# Patient Record
Sex: Female | Born: 1970 | Hispanic: Yes | Marital: Married | Smoking: Never smoker
Health system: Southern US, Community
[De-identification: ages and names within clinical notes are randomized; demographics above are authoritative.]

## PROBLEM LIST (undated history)

## (undated) DIAGNOSIS — A609 Anogenital herpesviral infection, unspecified: Secondary | ICD-10-CM

## (undated) DIAGNOSIS — I10 Essential (primary) hypertension: Secondary | ICD-10-CM

## (undated) DIAGNOSIS — D509 Iron deficiency anemia, unspecified: Secondary | ICD-10-CM

## (undated) DIAGNOSIS — E663 Overweight: Secondary | ICD-10-CM

## (undated) DIAGNOSIS — I248 Other forms of acute ischemic heart disease: Secondary | ICD-10-CM

## (undated) DIAGNOSIS — D3500 Benign neoplasm of unspecified adrenal gland: Secondary | ICD-10-CM

## (undated) DIAGNOSIS — I2489 Other forms of acute ischemic heart disease: Secondary | ICD-10-CM

## (undated) DIAGNOSIS — I5189 Other ill-defined heart diseases: Secondary | ICD-10-CM

## (undated) DIAGNOSIS — I454 Nonspecific intraventricular block: Secondary | ICD-10-CM

## (undated) DIAGNOSIS — E119 Type 2 diabetes mellitus without complications: Secondary | ICD-10-CM

## (undated) DIAGNOSIS — I4510 Unspecified right bundle-branch block: Secondary | ICD-10-CM

## (undated) DIAGNOSIS — D649 Anemia, unspecified: Secondary | ICD-10-CM

## (undated) HISTORY — PX: OTHER SURGICAL HISTORY: SHX169

## (undated) HISTORY — DX: Other ill-defined heart diseases: I51.89

## (undated) HISTORY — PX: TUBAL LIGATION: SHX77

---

## 2005-07-09 ENCOUNTER — Ambulatory Visit: Payer: Self-pay | Admitting: Obstetrics and Gynecology

## 2005-08-25 ENCOUNTER — Inpatient Hospital Stay: Payer: Self-pay | Admitting: Obstetrics and Gynecology

## 2010-03-28 ENCOUNTER — Ambulatory Visit: Payer: Self-pay | Admitting: Unknown Physician Specialty

## 2010-08-22 ENCOUNTER — Emergency Department: Payer: Self-pay | Admitting: Emergency Medicine

## 2011-05-20 ENCOUNTER — Ambulatory Visit: Payer: Self-pay | Admitting: Unknown Physician Specialty

## 2012-05-29 ENCOUNTER — Emergency Department: Payer: Self-pay | Admitting: Emergency Medicine

## 2012-06-07 ENCOUNTER — Emergency Department: Payer: Self-pay | Admitting: Internal Medicine

## 2012-07-14 ENCOUNTER — Ambulatory Visit: Payer: Self-pay | Admitting: Unknown Physician Specialty

## 2012-10-15 ENCOUNTER — Ambulatory Visit: Payer: Self-pay | Admitting: Unknown Physician Specialty

## 2012-11-19 ENCOUNTER — Ambulatory Visit: Payer: Self-pay | Admitting: Unknown Physician Specialty

## 2012-12-07 ENCOUNTER — Ambulatory Visit: Payer: Self-pay | Admitting: Gastroenterology

## 2012-12-08 LAB — PATHOLOGY REPORT

## 2012-12-20 ENCOUNTER — Ambulatory Visit: Payer: Self-pay | Admitting: Unknown Physician Specialty

## 2012-12-20 LAB — CREATININE, SERUM
EGFR (African American): >60
EGFR (Non-African Amer.): >60

## 2013-02-06 HISTORY — PX: OTHER SURGICAL HISTORY: SHX169

## 2013-02-24 ENCOUNTER — Ambulatory Visit: Payer: Self-pay | Admitting: Surgery

## 2013-02-24 LAB — CBC WITH DIFFERENTIAL/PLATELET
Basophil #: 0.1 10*3/uL (ref 0.0–0.1)
Basophil %: 0.8 %
HGB: 12.6 g/dL (ref 12.0–16.0)
Lymphocyte #: 2.9 10*3/uL (ref 1.0–3.6)
MCH: 29.3 pg (ref 26.0–34.0)
Monocyte #: 0.6 x10 3/mm (ref 0.2–0.9)
Monocyte %: 7.3 %
Neutrophil %: 51.7 %
RBC: 4.3 10*6/uL (ref 3.80–5.20)
RDW: 14.2 % (ref 11.5–14.5)
WBC: 7.6 10*3/uL (ref 3.6–11.0)

## 2013-02-24 LAB — PROTIME-INR
INR: 0.9
Prothrombin Time: 12.7 secs (ref 11.5–14.7)

## 2013-02-24 LAB — COMPREHENSIVE METABOLIC PANEL
BUN: 10 mg/dL (ref 7–18)
Bilirubin,Total: 0.3 mg/dL (ref 0.2–1.0)
Chloride: 105 mmol/L (ref 98–107)
Creatinine: 0.52 mg/dL — ABNORMAL LOW (ref 0.60–1.30)
Osmolality: 274 (ref 275–301)
Potassium: 3.6 mmol/L (ref 3.5–5.1)
SGOT(AST): 18 U/L (ref 15–37)
Sodium: 138 mmol/L (ref 136–145)

## 2013-02-24 LAB — APTT: Activated PTT: 34.7 secs (ref 23.6–35.9)

## 2013-02-28 ENCOUNTER — Inpatient Hospital Stay: Payer: Self-pay | Admitting: Surgery

## 2013-03-01 LAB — COMPREHENSIVE METABOLIC PANEL
Albumin: 2.6 g/dL — ABNORMAL LOW (ref 3.4–5.0)
Anion Gap: 7 (ref 7–16)
BUN: 3 mg/dL — ABNORMAL LOW (ref 7–18)
Calcium, Total: 7.7 mg/dL — ABNORMAL LOW (ref 8.5–10.1)
Chloride: 105 mmol/L (ref 98–107)
Co2: 25 mmol/L (ref 21–32)
Creatinine: 0.4 mg/dL — ABNORMAL LOW (ref 0.60–1.30)
EGFR (African American): >60
Glucose: 96 mg/dL (ref 65–99)
Potassium: 2.9 mmol/L — ABNORMAL LOW (ref 3.5–5.1)
SGOT(AST): 22 U/L (ref 15–37)
SGPT (ALT): 25 U/L (ref 12–78)
Sodium: 137 mmol/L (ref 136–145)

## 2013-03-01 LAB — CBC WITH DIFFERENTIAL/PLATELET
Eosinophil #: 0.1 10*3/uL (ref 0.0–0.7)
Eosinophil %: 0.6 %
Lymphocyte #: 1.6 10*3/uL (ref 1.0–3.6)
MCHC: 34.2 g/dL (ref 32.0–36.0)
MCV: 88 fL (ref 80–100)
Monocyte %: 6.4 %
Neutrophil %: 76.5 %
Platelet: 288 10*3/uL (ref 150–440)
RBC: 3.83 10*6/uL (ref 3.80–5.20)
RDW: 14.2 % (ref 11.5–14.5)

## 2013-03-02 LAB — CBC WITH DIFFERENTIAL/PLATELET
Basophil #: 0 10*3/uL (ref 0.0–0.1)
Basophil %: 0.4 %
Eosinophil %: 0.7 %
HCT: 31.8 % — ABNORMAL LOW (ref 35.0–47.0)
HGB: 10.8 g/dL — ABNORMAL LOW (ref 12.0–16.0)
Lymphocyte #: 1.9 10*3/uL (ref 1.0–3.6)
Lymphocyte %: 19.1 %
MCH: 29.8 pg (ref 26.0–34.0)
MCHC: 34 g/dL (ref 32.0–36.0)
MCV: 88 fL (ref 80–100)
Monocyte #: 0.7 x10 3/mm (ref 0.2–0.9)
Neutrophil #: 7 10*3/uL — ABNORMAL HIGH (ref 1.4–6.5)
Platelet: 292 10*3/uL (ref 150–440)
RBC: 3.63 10*6/uL — ABNORMAL LOW (ref 3.80–5.20)
RDW: 14.3 % (ref 11.5–14.5)

## 2013-03-02 LAB — BASIC METABOLIC PANEL
Anion Gap: 5 — ABNORMAL LOW (ref 7–16)
BUN: 3 mg/dL — ABNORMAL LOW (ref 7–18)
Chloride: 109 mmol/L — ABNORMAL HIGH (ref 98–107)
Co2: 27 mmol/L (ref 21–32)
Creatinine: 0.59 mg/dL — ABNORMAL LOW (ref 0.60–1.30)
EGFR (African American): >60
EGFR (Non-African Amer.): >60
Glucose: 95 mg/dL (ref 65–99)
Potassium: 3.2 mmol/L — ABNORMAL LOW (ref 3.5–5.1)
Sodium: 141 mmol/L (ref 136–145)

## 2013-03-02 LAB — PATHOLOGY REPORT

## 2013-12-06 ENCOUNTER — Ambulatory Visit: Payer: Self-pay | Admitting: Internal Medicine

## 2014-09-14 IMAGING — CT CT ABD-PELV W/ CM
1 of 3 series · 13 of 32 positions shown, 18 images · non-contrast
Comparison: none

REASON FOR EXAM: CALL REPORT [DATE] X 9911 sw Press LLQ abd pain Eval for
stone or mass Recta...
COMMENTS:

[Series 2: abd 3mm w 3.0 i40f 3 · axial · 0.81mm/px · z∈[-336,+56]mm · 13 of 149 slices shown, 18 images]
[im 9/149  soft-tissue]
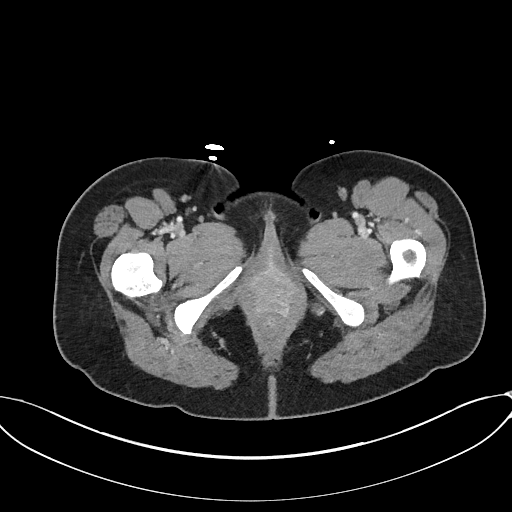
[im 9/149  bone]
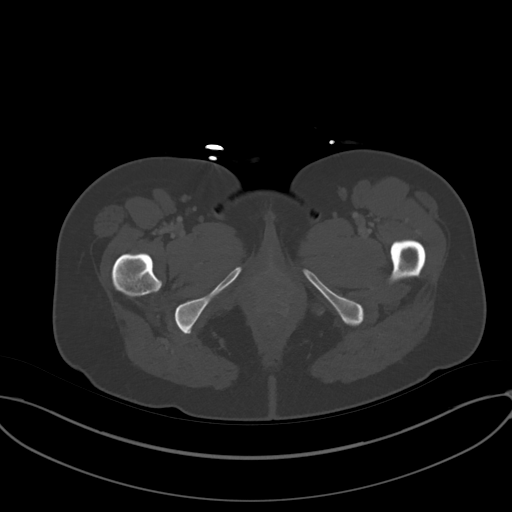
[im 27/149  soft-tissue]
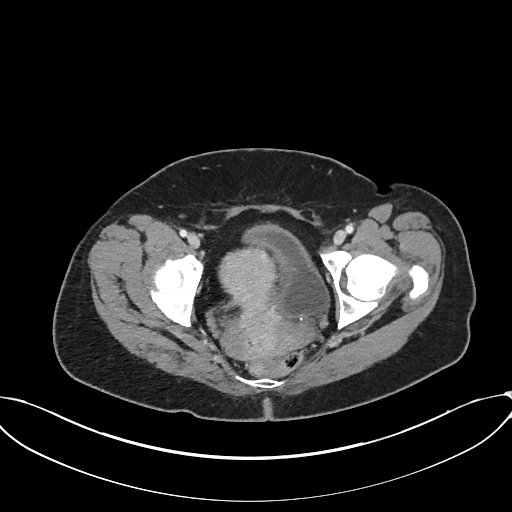
[im 35/149  soft-tissue]
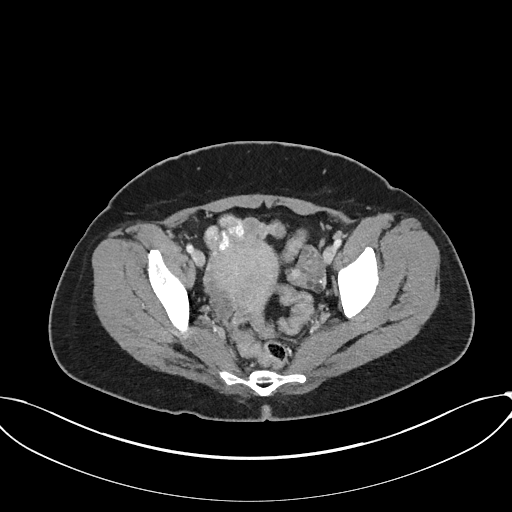
[im 44/149  soft-tissue]
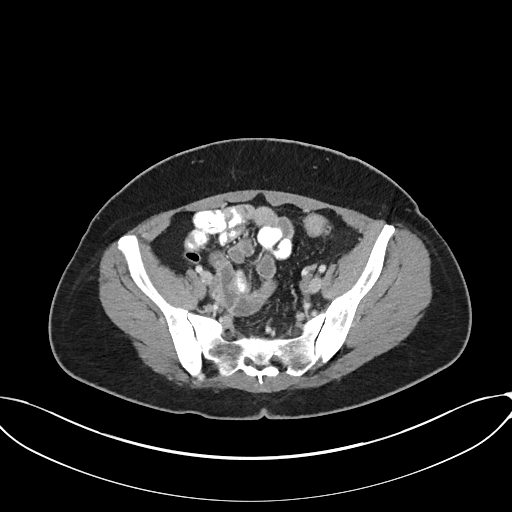
[im 61/149  soft-tissue]
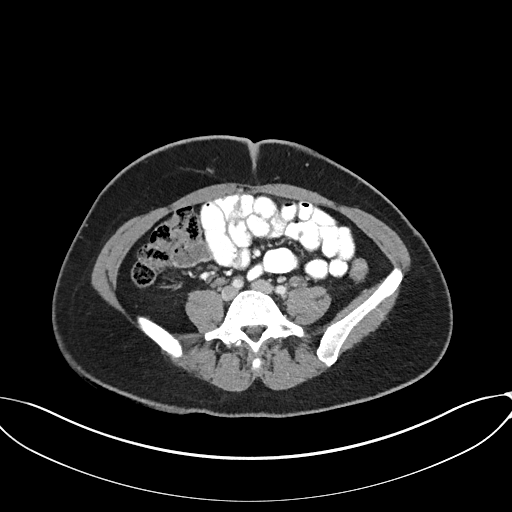
[im 70/149  soft-tissue]
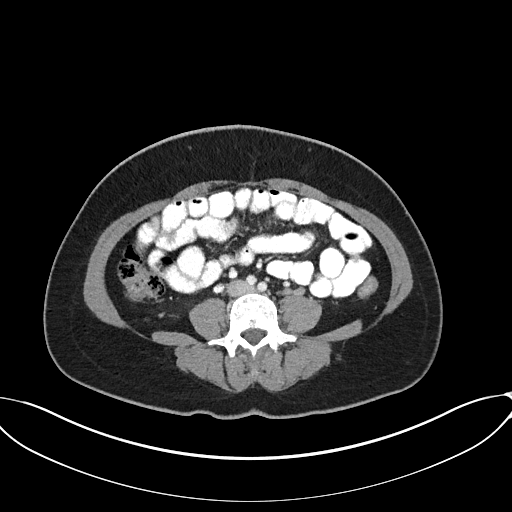
[im 79/149  soft-tissue]
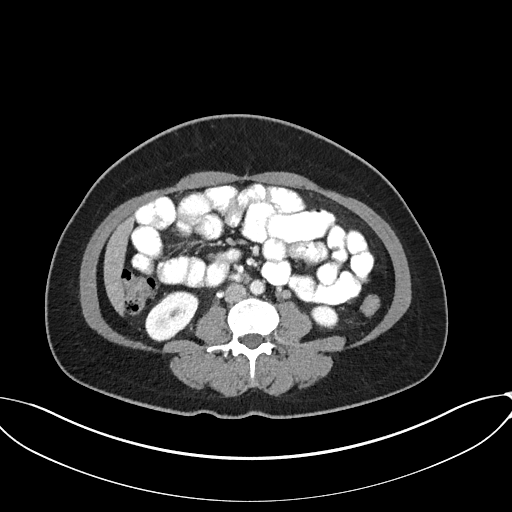
[im 96/149  soft-tissue]
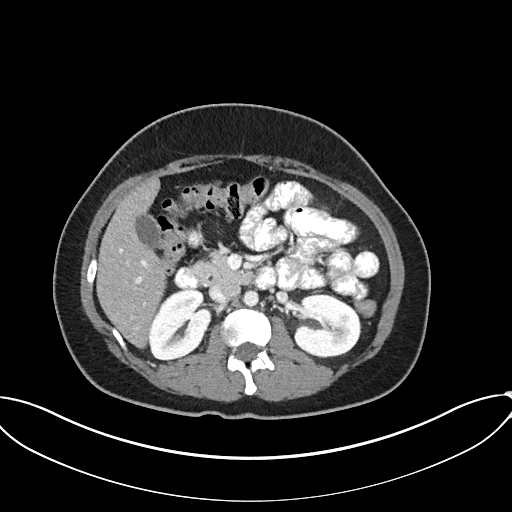
[im 105/149  soft-tissue]
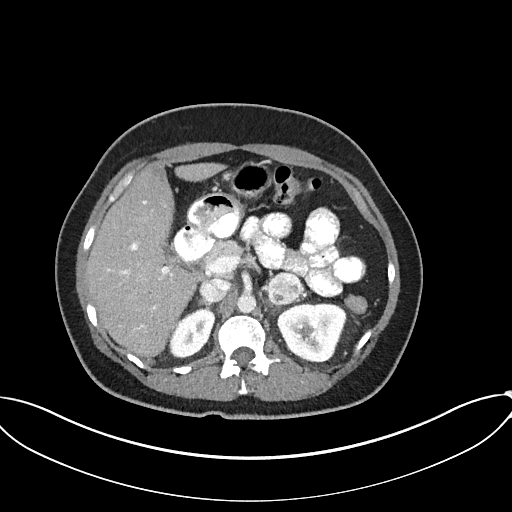
[im 105/149  bone]
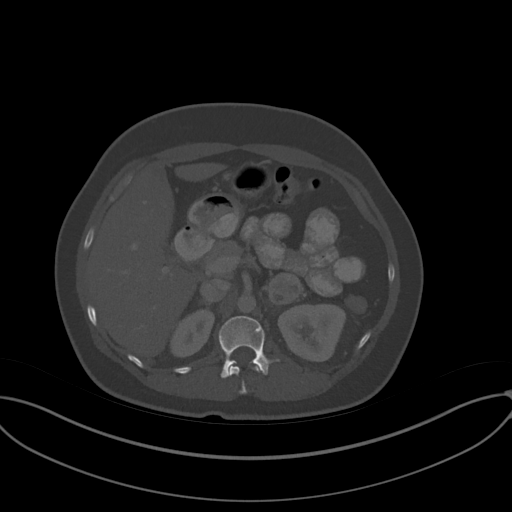
[im 114/149  soft-tissue]
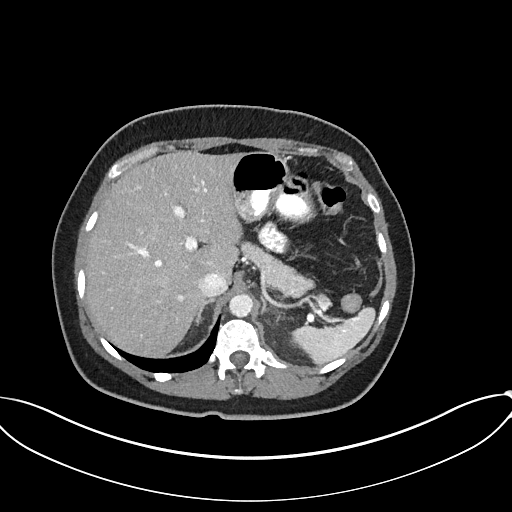
[im 114/149  lung]
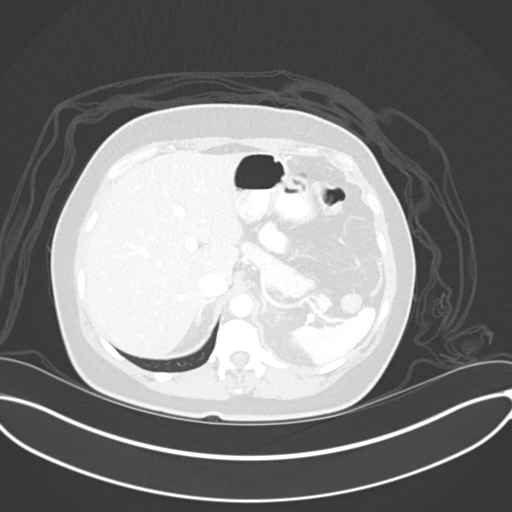
[im 122/149  lung]
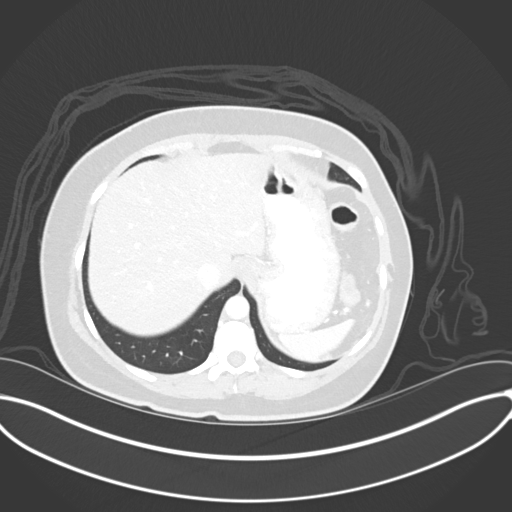
[im 131/149  soft-tissue]
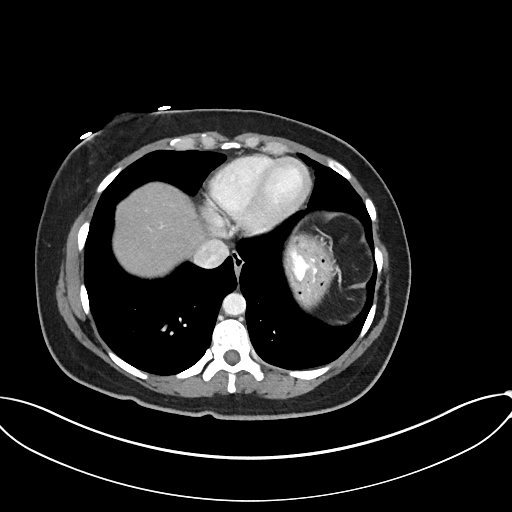
[im 131/149  lung]
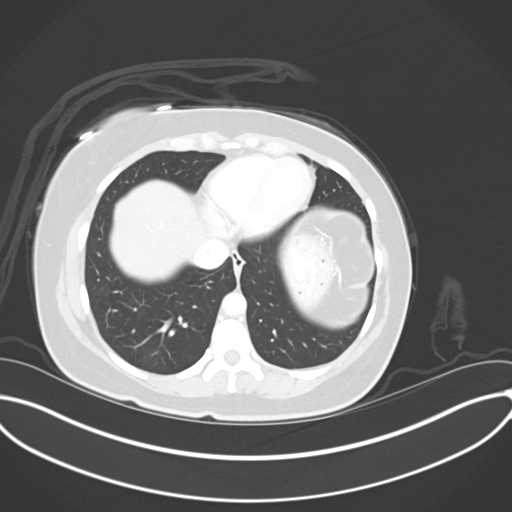
[im 140/149  soft-tissue]
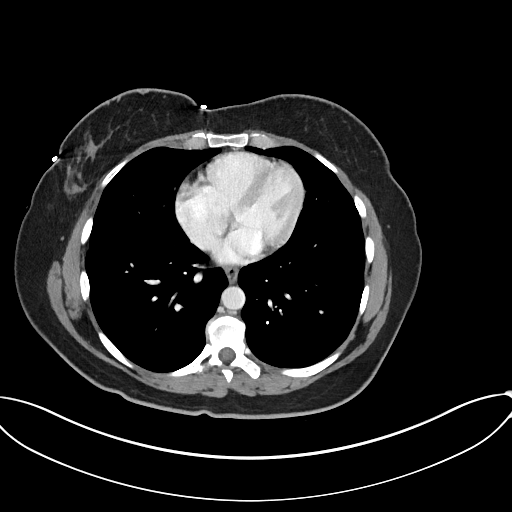
[im 140/149  lung]
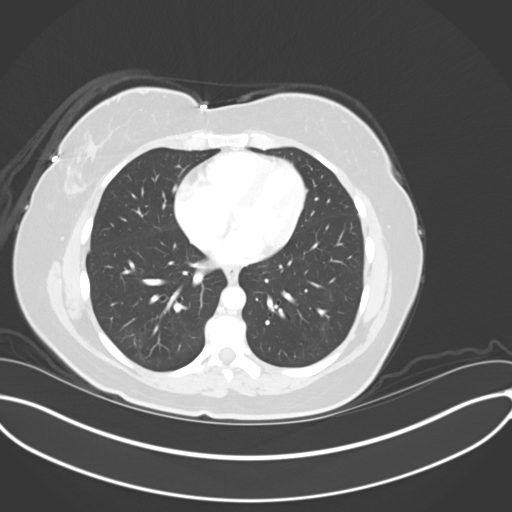

[13 of 32 positions shown; findings below may reference images not displayed]

PROCEDURE:     KCT - KCT ABDOMEN/PELVIS W  - November 19, 2012  [DATE]

RESULT:      CT of the abdomen and pelvis is performed utilizing 85 mL of
Hsovue-EVR iodinated intravenous contrast and oral contrast. There is no
previous exam for comparison. Delayed postcontrast images were obtained
through the kidneys only. The oral contrast has not passed completely
through the small bowel or intracolon at the time of imaging.

The lung bases appear grossly clear with no discrete mass, infiltrate or
effusion. The spleen is unremarkable. There appears to be a normal
appearance of the liver with some fatty infiltration present. Gallbladder
shows no radiopaque stones. The kidneys show no hydronephrosis. There is no
definite evidence of hydroureter although the distal ureters are not well
seen given the contrast-enhanced exam with oral contrast present. The uterus
and adnexal structures appear to be within normal limits. The appendix is
unremarkable. There is no ascites or abnormal fluid collection area there is
no abscess or acute inflammatory change. The stomach, small bowel and colon
appear to be grossly normal. The adrenal glands, aorta and pancreas appear
within normal limits. There is no pancreatic mass or accurate ductal
dilation. No adenopathy is evident. The bony structures appear unremarkable.
The rectum is not well seen given the lack of air, stool or contrast.
IMPRESSION: 1. No definite hydronephrosis or nephrolithiasis. The distal ureters not
well seen. The appendix is normal. No radiopaque gallstones are evident. The
lung bases are clear. No acute abnormality evident.

[REDACTED]

Addendum:
1. There is a mass along the inferior aspect of the left adrenal not
mentioned above. On the immediate post contrast images this measures 2.3 cm
and has a Hounsfield reading of 164. The postcontrast delayed images show
significant decrease in opacification of the mass with a Hounsfield reading
of 99. Given that no noncontrast images were obtained a relative and
absolute contrast washout cannot be determined. Further investigation with
MRI may be beneficial to ensure this is an adrenal adenoma versus a
functioning or possibly malignant lesion.

## 2014-12-29 NOTE — Discharge Summary (Signed)
PATIENT NAME:  Leah Stephenson, Leah Stephenson MR#:  [id number] DATE OF BIRTH:  May 14, 1971  DATE OF ADMISSION:  02/28/2013 DATE OF DISCHARGE:  03/04/2013  FINAL DIAGNOSIS: Left adrenal pheochromocytoma.   PROCEDURE PERFORMED: Laparoscopic-assisted adrenalectomy on 02/28/2013.   POSTOPERATIVE COURSE: The patient had an uneventful stay. She was monitored for 6 hours in the PACU. She remained hemodynamically stable. She was remained on D10 infusion over the course of the evening. This was able to be weaned. Her glucose remained well controlled. Blood pressure was well controlled on no medications postoperatively. Pain was a major issue. This was controlled with PCA and eventually the patient on postoperative day #3 was able to be weaned off of her PCA and oxygen. She was fully ambulatory with minimal tenderness, tolerating a regular diet with blood pressure 125/84. On postoperative day #4, the patient was deemed suitable for discharge. Her wounds were healing nicely.   DISCHARGE MEDICATIONS: Metformin 500 mg by mouth once a day in the morning and 2 tablets at bedtime, Norco 5/325 mg 1 tab every 6 hours as needed for pain.   DISCHARGE INSTRUCTIONS: Follow up in our office in 5 to 6 days time in the [facility] location for staple removal and followup. Pathology did return benign pheochromocytoma. Call with any questions or concerns.  ____________________________ Jeannette How Marina Gravel, MD mab:aw D: 03/18/2013 02:52:19 ET T: 03/18/2013 06:49:55 ET JOB#: [id number]  cc: Elta [place] A. Marina Gravel, MD, <Dictator> Lorenza Evangelist, MD [place] MD ELECTRONICALLY SIGNED 03/20/2013 21:57

## 2014-12-29 NOTE — Op Note (Signed)
PATIENT NAME:  Leah Stephenson, Leah Stephenson MR#:  [id number] DATE OF BIRTH:  [birth year]  DATE OF PROCEDURE:  02/28/2013  PREOPERATIVE DIAGNOSIS:  Pheochromocytoma, left adrenal gland.   POSTOPERATIVE DIAGNOSIS:  Pheochromocytoma, left adrenal gland.   SURGEON:  Consuela Mimes, M.D.   FIRST ASSISTANT:  Sherri Rad, M.D.   ANESTHESIA:  General.   OPERATION PERFORMED:  1.  Hand-assisted laparoscopic left adrenalectomy.  2.  Hand-assisted laparoscopic splenic flexure mobilization.   PROCEDURE IN DETAIL: The patient was placed in the modified lithotomy position and prepped and draped in the usual sterile fashion. A 4 inch incision was made in the midline in the periumbilical area, and this was carried down through the subcutaneous tissue and through the linea alba and into the peritoneum carefully. A GelPort was placed, and a 15 mmHg CO2 pneumoperitoneum was created, and a 12 mm trocar was placed in the left mid abdomen, and two 5 mm trocars were placed in the right upper quadrant. The white line of Toldt of the descending colon was taken down, and the splenic flexure of the colon was completely mobilized off of Gerota's fascia, and small vessel was taken going to the spleen, which caused an insignificant and small splenic infarct but presented avulsion. The tumor was easily seen and palpated medial to the upper pole of the left kidney, and initial dissection was begun at the inferior border of the tumor such that the left renal vein could be dissected out. This was done successfully, and an upper pole left renal vein was found and then the left renal vein was traced medially toward the right side until the left adrenal vein was found, and it was encircled and doubly clipped with Kelly-Wick locking clips towards the patient's side, and a single clip was placed towards the adrenal, and the adrenal vein was divided. The remainder of the left adrenalectomy was performed with the Harmonic scalpel, and individual small  arterial bleeders were controlled with the Harmonic scalpel, and the adrenal gland was then removed from the patient. The patient experienced a little bit of tachycardia and required esmolol for a small portion of the procedure to be administered by anesthesiology. A single lymph node was then removed and sent as a separate specimen, and the splenic flexure was replaced in its anatomic position after hemostasis was deemed excellent, and the 12 mm port site was closed with a laparoscopic puncture closure device and a 0 Vicryl suture under direct visualization. The remaining trocars were removed, and the GelPort was removed, and the linea alba was closed with a running #1 PDS suture, and all the skin sites were closed with skin stapling device and sterile dressings were applied. The patient required a little bit of phenylephrine or Neo-Synephrine IV drip at the conclusion of the procedure. This was discontinued upon entrance to the PACU.   ESTIMATED BLOOD LOSS:  75 mL.     ____________________________ Consuela Mimes, MD wfm:dmm D: 02/28/2013 12:35:04 ET T: 02/28/2013 12:50:04 ET JOB#: [id number]  cc: Consuela Mimes, MD, <Dictator> Consuela Mimes MD ELECTRONICALLY SIGNED 03/06/2013 20:22

## 2015-06-04 ENCOUNTER — Other Ambulatory Visit: Payer: Self-pay | Admitting: Internal Medicine

## 2015-06-04 DIAGNOSIS — Z1239 Encounter for other screening for malignant neoplasm of breast: Secondary | ICD-10-CM

## 2015-06-13 ENCOUNTER — Ambulatory Visit: Payer: Self-pay | Attending: Internal Medicine

## 2016-06-19 ENCOUNTER — Other Ambulatory Visit: Payer: Self-pay | Admitting: Internal Medicine

## 2016-06-19 DIAGNOSIS — Z1239 Encounter for other screening for malignant neoplasm of breast: Secondary | ICD-10-CM

## 2016-07-24 ENCOUNTER — Ambulatory Visit: Payer: Self-pay

## 2016-09-03 ENCOUNTER — Ambulatory Visit
Admission: RE | Admit: 2016-09-03 | Discharge: 2016-09-03 | Disposition: A | Discharge status: Home / Self Care | Payer: Self-pay | Source: Ambulatory Visit | Attending: Internal Medicine | Admitting: Internal Medicine

## 2016-09-03 DIAGNOSIS — Z1239 Encounter for other screening for malignant neoplasm of breast: Secondary | ICD-10-CM

## 2016-09-03 DIAGNOSIS — Z1231 Encounter for screening mammogram for malignant neoplasm of breast: Secondary | ICD-10-CM | POA: Insufficient documentation

## 2017-12-29 ENCOUNTER — Other Ambulatory Visit: Payer: Self-pay | Admitting: Internal Medicine

## 2017-12-29 DIAGNOSIS — Z1231 Encounter for screening mammogram for malignant neoplasm of breast: Secondary | ICD-10-CM

## 2018-01-27 ENCOUNTER — Ambulatory Visit
Admission: RE | Admit: 2018-01-27 | Discharge: 2018-01-27 | Disposition: A | Discharge status: Home / Self Care | Payer: BLUE CROSS/BLUE SHIELD | Source: Ambulatory Visit | Attending: Internal Medicine | Admitting: Internal Medicine

## 2018-01-27 DIAGNOSIS — Z1231 Encounter for screening mammogram for malignant neoplasm of breast: Secondary | ICD-10-CM | POA: Insufficient documentation

## 2018-02-04 ENCOUNTER — Other Ambulatory Visit: Payer: Self-pay | Admitting: Internal Medicine

## 2018-02-04 DIAGNOSIS — N6489 Other specified disorders of breast: Secondary | ICD-10-CM

## 2018-02-04 DIAGNOSIS — R928 Other abnormal and inconclusive findings on diagnostic imaging of breast: Secondary | ICD-10-CM

## 2018-02-12 ENCOUNTER — Ambulatory Visit
Admission: RE | Admit: 2018-02-12 | Discharge: 2018-02-12 | Disposition: A | Discharge status: Home / Self Care | Payer: BLUE CROSS/BLUE SHIELD | Source: Ambulatory Visit | Attending: Internal Medicine | Admitting: Internal Medicine

## 2018-02-12 ENCOUNTER — Ambulatory Visit: Payer: BLUE CROSS/BLUE SHIELD

## 2018-07-08 ENCOUNTER — Encounter: Payer: Self-pay | Admitting: *Deleted

## 2018-07-09 ENCOUNTER — Ambulatory Visit
Admission: RE | Admit: 2018-07-09 | Discharge: 2018-07-09 | Disposition: A | Discharge status: Home / Self Care | Payer: BLUE CROSS/BLUE SHIELD | Source: Ambulatory Visit | Attending: Gastroenterology | Admitting: Gastroenterology

## 2018-07-09 ENCOUNTER — Ambulatory Visit: Payer: BLUE CROSS/BLUE SHIELD | Admitting: Anesthesiology

## 2018-07-09 ENCOUNTER — Encounter: Payer: Self-pay | Admitting: *Deleted

## 2018-07-09 ENCOUNTER — Encounter
Admission: RE | Disposition: A | Discharge status: Home / Self Care | Payer: Self-pay | Source: Ambulatory Visit | Attending: Gastroenterology

## 2018-07-09 DIAGNOSIS — K2950 Unspecified chronic gastritis without bleeding: Secondary | ICD-10-CM | POA: Insufficient documentation

## 2018-07-09 DIAGNOSIS — I10 Essential (primary) hypertension: Secondary | ICD-10-CM | POA: Insufficient documentation

## 2018-07-09 DIAGNOSIS — K921 Melena: Secondary | ICD-10-CM | POA: Insufficient documentation

## 2018-07-09 DIAGNOSIS — Z86010 Personal history of colonic polyps: Secondary | ICD-10-CM | POA: Diagnosis not present

## 2018-07-09 DIAGNOSIS — D120 Benign neoplasm of cecum: Secondary | ICD-10-CM | POA: Diagnosis not present

## 2018-07-09 DIAGNOSIS — K5730 Diverticulosis of large intestine without perforation or abscess without bleeding: Secondary | ICD-10-CM | POA: Insufficient documentation

## 2018-07-09 DIAGNOSIS — D509 Iron deficiency anemia, unspecified: Secondary | ICD-10-CM | POA: Insufficient documentation

## 2018-07-09 DIAGNOSIS — D125 Benign neoplasm of sigmoid colon: Secondary | ICD-10-CM | POA: Insufficient documentation

## 2018-07-09 HISTORY — DX: Essential (primary) hypertension: I10

## 2018-07-09 HISTORY — DX: Anemia, unspecified: D64.9

## 2018-07-09 HISTORY — PX: COLONOSCOPY WITH PROPOFOL: SHX5780

## 2018-07-09 HISTORY — DX: Nonspecific intraventricular block: I45.4

## 2018-07-09 HISTORY — PX: ESOPHAGOGASTRODUODENOSCOPY (EGD) WITH PROPOFOL: SHX5813

## 2018-07-09 HISTORY — DX: Benign neoplasm of unspecified adrenal gland: D35.00

## 2018-07-09 HISTORY — DX: Anogenital herpesviral infection, unspecified: A60.9

## 2018-07-09 SURGERY — COLONOSCOPY WITH PROPOFOL
Anesthesia: General

## 2018-07-09 MED ORDER — PHENYLEPHRINE HCL 10 MG/ML IJ SOLN
INTRAMUSCULAR | Status: DC | PRN
  Administered 2018-07-09: 200 ug via INTRAVENOUS

## 2018-07-09 MED ORDER — LIDOCAINE HCL (PF) 1 % IJ SOLN
2.0000 mL | Freq: Once | INTRAMUSCULAR | Status: AC
  Administered 2018-07-09: 0.3 mL via INTRADERMAL

## 2018-07-09 MED ORDER — PROPOFOL 10 MG/ML IV BOLUS
INTRAVENOUS | Status: AC
  Filled 2018-07-09: qty 40

## 2018-07-09 MED ORDER — PROPOFOL 500 MG/50ML IV EMUL
INTRAVENOUS | Status: DC | PRN
  Administered 2018-07-09: 100 ug/kg/min via INTRAVENOUS

## 2018-07-09 MED ORDER — LIDOCAINE HCL (PF) 1 % IJ SOLN
INTRAMUSCULAR | Status: AC
  Administered 2018-07-09: 0.3 mL via INTRADERMAL
  Filled 2018-07-09: qty 2

## 2018-07-09 MED ORDER — LIDOCAINE HCL (CARDIAC) PF 100 MG/5ML IV SOSY
PREFILLED_SYRINGE | INTRAVENOUS | Status: DC | PRN
  Administered 2018-07-09: 80 mg via INTRAVENOUS

## 2018-07-09 MED ORDER — FENTANYL CITRATE (PF) 100 MCG/2ML IJ SOLN
INTRAMUSCULAR | Status: AC
  Filled 2018-07-09: qty 2

## 2018-07-09 MED ORDER — PROPOFOL 10 MG/ML IV BOLUS
INTRAVENOUS | Status: DC | PRN
  Administered 2018-07-09: 70 mg via INTRAVENOUS
  Administered 2018-07-09: 50 mg via INTRAVENOUS
  Administered 2018-07-09: 30 mg via INTRAVENOUS

## 2018-07-09 MED ORDER — SODIUM CHLORIDE 0.9 % IV SOLN
INTRAVENOUS | Status: DC
  Administered 2018-07-09: 1000 mL via INTRAVENOUS

## 2018-07-09 MED ORDER — FENTANYL CITRATE (PF) 100 MCG/2ML IJ SOLN
INTRAMUSCULAR | Status: DC | PRN
  Administered 2018-07-09: 50 ug via INTRAVENOUS
  Administered 2018-07-09 (×2): 25 ug via INTRAVENOUS

## 2018-07-09 NOTE — Op Note (Signed)
[facility] Gastroenterology Patient Name: Leah Stephenson Procedure Date: 07/09/2018 9:38 AM MRN: [id number] Account #: [id number] Date of Birth: Nov 23, 1970 Admit Type: Outpatient Age: 47 Room: [facility] ENDO ROOM 3 Gender: Female Note Status: Finalized Procedure:            Upper GI endoscopy Indications:          Iron deficiency anemia, Dyspepsia, Melena Providers:            Lollie Sails, MD Referring MD:         Glendon Axe (Referring MD) Medicines:            Monitored Anesthesia Care Complications:        No immediate complications. Procedure:            Pre-Anesthesia Assessment:                       - ASA Grade Assessment: II - A patient with mild                        systemic disease.                       After obtaining informed consent, the endoscope was                        passed under direct vision. Throughout the procedure,                        the patient's blood pressure, pulse, and oxygen                        saturations were monitored continuously. The Endoscope                        was introduced through the mouth, and advanced to the                        third part of duodenum. The upper GI endoscopy was                        accomplished without difficulty. The patient tolerated                        the procedure well. Findings:      The Z-line was variable. Biopsies were taken with a cold forceps for       histology.      The exam of the esophagus was otherwise normal.      The entire examined stomach was normal. Biopsies were taken with a cold       forceps for histology. Biopsies were taken with a cold forceps for       Helicobacter pylori testing.      The examined duodenum was normal.      The cardia and gastric fundus were normal on retroflexion. Impression:           - Z-line variable. Biopsied.                       - Normal stomach. Biopsied.                       -  Normal examined duodenum. Recommendation:        - Use Prilosec (omeprazole) 20 mg PO daily daily. Procedure Code(s):    --- Professional ---                       [id number], Esophagogastroduodenoscopy, flexible, transoral;                        with biopsy, single or multiple CPT copyright 2018 American Medical Association. All rights reserved. The codes documented in this report are preliminary and upon coder review may  be revised to meet current compliance requirements. Lollie Sails, MD 07/09/2018 10:16:55 AM This report has been signed electronically. Number of Addenda: 0 Note Initiated On: 07/09/2018 9:38 AM      [facility]

## 2018-07-09 NOTE — Anesthesia Post-op Follow-up Note (Signed)
Anesthesia QCDR form completed.        

## 2018-07-09 NOTE — Anesthesia Preprocedure Evaluation (Signed)
Anesthesia Evaluation  Patient identified by MRN, date of birth, ID band Patient awake    Reviewed: Allergy & Precautions, NPO status , Patient's Chart, lab work & pertinent test results  History of Anesthesia Complications Negative for: history of anesthetic complications  Airway Mallampati: II       Dental   Pulmonary neg sleep apnea, neg COPD,           Cardiovascular hypertension (resolved), (-) Past MI and (-) CHF (-) dysrhythmias (-) Valvular Problems/Murmurs     Neuro/Psych neg Seizures    GI/Hepatic Neg liver ROS, GERD  Medicated,  Endo/Other  neg diabetes  Renal/GU negative Renal ROS     Musculoskeletal   Abdominal   Peds  Hematology  (+) anemia ,   Anesthesia Other Findings   Reproductive/Obstetrics                             Anesthesia Physical Anesthesia Plan  ASA: II  Anesthesia Plan: General   Post-op Pain Management:    Induction: Intravenous  PONV Risk Score and Plan: 3 and Propofol infusion, TIVA and Ondansetron  Airway Management Planned: Nasal Cannula  Additional Equipment:   Intra-op Plan:   Post-operative Plan:   Informed Consent: I have reviewed the patients History and Physical, chart, labs and discussed the procedure including the risks, benefits and alternatives for the proposed anesthesia with the patient or authorized representative who has indicated his/her understanding and acceptance.     Plan Discussed with:   Anesthesia Plan Comments:         Anesthesia Quick Evaluation

## 2018-07-09 NOTE — Anesthesia Postprocedure Evaluation (Signed)
Anesthesia Post Note  Patient: Leah Stephenson  Procedure(s) Performed: COLONOSCOPY WITH PROPOFOL (N/A ) ESOPHAGOGASTRODUODENOSCOPY (EGD) WITH PROPOFOL (N/A )  Patient location during evaluation: Endoscopy Anesthesia Type: General Level of consciousness: awake and alert Pain management: pain level controlled Vital Signs Assessment: post-procedure vital signs reviewed and stable Respiratory status: spontaneous breathing and respiratory function stable Cardiovascular status: stable Anesthetic complications: no     Last Vitals:  Vitals:   07/09/18 1046 07/09/18 1056  BP: 117/88 (!) 123/95  Pulse: 76 82  Resp: 17 19  Temp:    SpO2: 100% 100%    Last Pain:  Vitals:   07/09/18 1056  TempSrc:   PainSc: 0-No pain                 Robi Mitter K

## 2018-07-09 NOTE — Transfer of Care (Signed)
Immediate Anesthesia Transfer of Care Note  Patient: Leah Stephenson  Procedure(s) Performed: COLONOSCOPY WITH PROPOFOL (N/A ) ESOPHAGOGASTRODUODENOSCOPY (EGD) WITH PROPOFOL (N/A )  Patient Location: PACU  Anesthesia Type:General  Level of Consciousness: awake  Airway & Oxygen Therapy: Patient Spontanous Breathing  Post-op Assessment: Report given to RN  Post vital signs: Reviewed and stable  Last Vitals:  Vitals Value Taken Time  BP 93/61 07/09/2018 10:36 AM  Temp 36.1 C 07/09/2018 10:36 AM  Pulse 80 07/09/2018 10:36 AM  Resp 16 07/09/2018 10:36 AM  SpO2 99 % 07/09/2018 10:36 AM    Last Pain:  Vitals:   07/09/18 1036  TempSrc: Tympanic  PainSc: Asleep         Complications: No apparent anesthesia complications

## 2018-07-09 NOTE — H&P (Addendum)
Outpatient short stay form Pre-procedure 07/09/2018 9:52 AM Lollie Sails MD  Primary Physician: Dr. Glendon Axe  Reason for visit: EGD and colonoscopy  History of present illness: Patient is a 47 year old female presenting today for EGD and colonoscopy.  She has a history of iron deficiency anemia.  Also personal history of adenomatous colon polyps with a last colonoscopy being done 12/07/2012.  She tolerated her prep well.  She takes no aspirin or blood thinning agent.  She is taking a daily proton pump inhibitor.  He has been treated in the past for Helicobacter pylori.  She had an episode of lack sticky stools at times.  She denies any problems with nausea or vomiting or problems swallowing.  Like stools occur but not often.  She only takes her proton pump inhibitor intermittently.  She does occasionally use NSAIDs.    Current Facility-Administered Medications:  .  0.9 %  sodium chloride infusion, , Intravenous, Continuous, Lollie Sails, MD, Last Rate: 20 mL/hr at 07/09/18 0929, 1,000 mL at 07/09/18 0929  Medications Prior to Admission  Medication Sig Dispense Refill Last Dose  . omeprazole (PRILOSEC) 20 MG capsule Take 20 mg by mouth daily.     . pantoprazole (PROTONIX) 40 MG tablet Take 40 mg by mouth daily.     . ranitidine (ZANTAC) 150 MG tablet Take 150 mg by mouth daily.        No Known Allergies   Past Medical History:  Diagnosis Date  . Anemia   . BBB (bundle branch block)   . BBB (bundle branch block)   . HSV (herpes simplex virus) anogenital infection   . Hypertension   . Pheochromocytoma     Review of systems:      Physical Exam    Heart and lungs: Regular rate and rhythm without rub or gallop, lungs are bilaterally clear.    HEENT: Normocephalic atraumatic eyes are anicteric    Other:    Pertinant exam for procedure: Soft nontender nondistended bowel sounds positive normoactive.    Planned proceedures: EGD, colonoscopy and indicated  procedures. I have discussed the risks benefits and complications of procedures to include not limited to bleeding, infection, perforation and the risk of sedation and the patient wishes to proceed.    Lollie Sails, MD Gastroenterology 07/09/2018  9:52 AM

## 2018-07-09 NOTE — Op Note (Signed)
[facility] Gastroenterology Patient Name: Leah Stephenson Procedure Date: 07/09/2018 9:37 AM MRN: [id number] Account #: [id number] Date of Birth: 1970/12/20 Admit Type: Outpatient Age: 47 Room: [facility] ENDO ROOM 3 Gender: Female Note Status: Finalized Procedure:            Colonoscopy Indications:          Iron deficiency anemia, Personal history of colonic                        polyps Providers:            Lollie Sails, MD Referring MD:         Glendon Axe (Referring MD) Medicines:            Monitored Anesthesia Care Complications:        No immediate complications. Procedure:            Pre-Anesthesia Assessment:                       - ASA Grade Assessment: II - A patient with mild                        systemic disease.                       After obtaining informed consent, the colonoscope was                        passed under direct vision. Throughout the procedure,                        the patient's blood pressure, pulse, and oxygen                        saturations were monitored continuously. The                        Colonoscope was introduced through the anus and                        advanced to the the cecum, identified by appendiceal                        orifice and ileocecal valve. The colonoscopy was                        performed without difficulty. The patient tolerated the                        procedure well. The quality of the bowel preparation                        was good. Findings:      A few small-mouthed diverticula were found in the sigmoid colon,       descending colon, transverse colon and ascending colon.      A 2 mm polyp was found in the proximal sigmoid colon. The polyp was       sessile. The polyp was removed with a cold biopsy forceps. Resection and       retrieval were complete.      A 1 mm polyp was found in  the cecum. The polyp was sessile. The polyp       was removed with a cold biopsy forceps. Resection  and retrieval were       complete.      The retroflexed view of the distal rectum and anal verge was normal and       showed no anal or rectal abnormalities.      The exam was otherwise without abnormality.      The digital rectal exam was normal. Impression:           - Diverticulosis in the sigmoid colon, in the                        descending colon, in the transverse colon and in the                        ascending colon.                       - One 2 mm polyp in the proximal sigmoid colon, removed                        with a cold biopsy forceps. Resected and retrieved.                       - One 1 mm polyp in the cecum, removed with a cold                        biopsy forceps. Resected and retrieved.                       - The distal rectum and anal verge are normal on                        retroflexion view.                       - The examination was otherwise normal. Recommendation:       - Discharge patient to home.                       - Soft diet today, then advance as tolerated to advance                        diet as tolerated.                       - if patient is heme positive and continues to have                        iron deficiency, consider SBS and VCE. Procedure Code(s):    --- Professional ---                       [phone], Colonoscopy, flexible; with biopsy, single or                        multiple Diagnosis Code(s):    --- Professional ---                       D12.5, Benign neoplasm of sigmoid colon  D12.0, Benign neoplasm of cecum                       D50.9, Iron deficiency anemia, unspecified                       Z86.010, Personal history of colonic polyps                       K57.30, Diverticulosis of large intestine without                        perforation or abscess without bleeding CPT copyright 2018 American Medical Association. All rights reserved. The codes documented in this report are preliminary and upon coder  review may  be revised to meet current compliance requirements. Lollie Sails, MD 07/09/2018 10:35:06 AM This report has been signed electronically. Number of Addenda: 0 Note Initiated On: 07/09/2018 9:37 AM Scope Withdrawal Time: 0 hours 5 minutes 21 seconds  Total Procedure Duration: 0 hours 11 minutes 10 seconds       [facility]

## 2018-07-12 ENCOUNTER — Encounter: Payer: Self-pay | Admitting: Gastroenterology

## 2018-07-13 LAB — SURGICAL PATHOLOGY

## 2019-07-20 ENCOUNTER — Other Ambulatory Visit: Payer: Self-pay | Admitting: Infectious Diseases

## 2019-07-20 DIAGNOSIS — R928 Other abnormal and inconclusive findings on diagnostic imaging of breast: Secondary | ICD-10-CM

## 2019-09-05 ENCOUNTER — Ambulatory Visit
Admission: RE | Admit: 2019-09-05 | Discharge: 2019-09-05 | Disposition: A | Discharge status: Home / Self Care | Payer: BC Managed Care – PPO | Source: Ambulatory Visit | Attending: Infectious Diseases | Admitting: Infectious Diseases

## 2019-09-05 DIAGNOSIS — R928 Other abnormal and inconclusive findings on diagnostic imaging of breast: Secondary | ICD-10-CM

## 2019-11-27 ENCOUNTER — Ambulatory Visit: Payer: Self-pay | Attending: Internal Medicine

## 2019-11-27 DIAGNOSIS — Z23 Encounter for immunization: Secondary | ICD-10-CM

## 2019-11-27 NOTE — Progress Notes (Signed)
   Covid-19 Vaccination Clinic  Name:  Leah Stephenson    MRN: [id number] DOB: Feb 16, 1971  11/27/2019  [name] was observed post Covid-19 immunization for 15 minutes without incident. She was provided with Vaccine Information Sheet and instruction to access the V-Safe system.   [name] was instructed to call 911 with any severe reactions post vaccine: Marland Kitchen Difficulty breathing  . Swelling of face and throat  . A fast heartbeat  . A bad rash all over body  . Dizziness and weakness   Immunizations Administered    Name Date Dose VIS Date Route   Pfizer COVID-19 Vaccine 11/27/2019  4:30 PM 0.3 mL 08/19/2019 Intramuscular   Manufacturer: [place]   Lot: [id number]   [place]: [id number]

## 2019-12-18 ENCOUNTER — Ambulatory Visit: Payer: Self-pay | Attending: Internal Medicine

## 2019-12-18 DIAGNOSIS — Z23 Encounter for immunization: Secondary | ICD-10-CM

## 2019-12-18 NOTE — Progress Notes (Signed)
   Covid-19 Vaccination Clinic  Name:  GLENDY BILLINGSLY    MRN: [id number] DOB: [birth year]  12/18/2019  Ms. Depaul was observed post Covid-19 immunization for 15 minutes without incident. She was provided with Vaccine Information Sheet and instruction to access the V-Safe system.   Ms. Doakes was instructed to call 911 with any severe reactions post vaccine: Marland Kitchen Difficulty breathing  . Swelling of face and throat  . A fast heartbeat  . A bad rash all over body  . Dizziness and weakness   Immunizations Administered    Name Date Dose VIS Date Route   Pfizer COVID-19 Vaccine 12/18/2019  3:49 PM 0.3 mL 08/19/2019 Intramuscular   Manufacturer: [place]   Lot: [id number]   [place]: [id number]

## 2022-01-09 ENCOUNTER — Emergency Department: Payer: 59

## 2022-01-09 ENCOUNTER — Other Ambulatory Visit: Payer: Self-pay

## 2022-01-09 DIAGNOSIS — Z7982 Long term (current) use of aspirin: Secondary | ICD-10-CM | POA: Insufficient documentation

## 2022-01-09 DIAGNOSIS — E119 Type 2 diabetes mellitus without complications: Secondary | ICD-10-CM | POA: Diagnosis not present

## 2022-01-09 DIAGNOSIS — I10 Essential (primary) hypertension: Secondary | ICD-10-CM | POA: Insufficient documentation

## 2022-01-09 DIAGNOSIS — K219 Gastro-esophageal reflux disease without esophagitis: Secondary | ICD-10-CM | POA: Insufficient documentation

## 2022-01-09 DIAGNOSIS — D509 Iron deficiency anemia, unspecified: Secondary | ICD-10-CM | POA: Insufficient documentation

## 2022-01-09 DIAGNOSIS — R079 Chest pain, unspecified: Principal | ICD-10-CM | POA: Insufficient documentation

## 2022-01-09 LAB — CBC
HCT: 33.1 % — ABNORMAL LOW (ref 36.0–46.0)
Hemoglobin: 10.2 g/dL — ABNORMAL LOW (ref 12.0–15.0)
MCH: 23.8 pg — ABNORMAL LOW (ref 26.0–34.0)
MCHC: 30.8 g/dL (ref 30.0–36.0)
MCV: 77.3 fL — ABNORMAL LOW (ref 80.0–100.0)
Platelets: 377 10*3/uL (ref 150–400)
RBC: 4.28 MIL/uL (ref 3.87–5.11)
RDW: 17.4 % — ABNORMAL HIGH (ref 11.5–15.5)
WBC: 7.3 10*3/uL (ref 4.0–10.5)
nRBC: 0 % (ref 0.0–0.2)

## 2022-01-09 NOTE — ED Triage Notes (Signed)
Pt presents to ER c/o chest pain that started around 1900 tonight.  Pt states pain is in central chest and is non-radiation.  Denies hx of heart problems.  States she has had some associated sob.  Pt endorses some nausea, but no dizziness.  Pt is currently A&O x4 and in NAD in triage.   ?

## 2022-01-10 ENCOUNTER — Encounter
Admission: EM | Disposition: A | Discharge status: Home / Self Care | Payer: Self-pay | Source: Home / Self Care | Attending: Emergency Medicine

## 2022-01-10 ENCOUNTER — Observation Stay
Admission: EM | Admit: 2022-01-10 | Discharge: 2022-01-11 | Disposition: A | Discharge status: Home / Self Care | Payer: 59 | Attending: Internal Medicine | Admitting: Internal Medicine

## 2022-01-10 ENCOUNTER — Encounter: Payer: Self-pay | Admitting: Family Medicine

## 2022-01-10 DIAGNOSIS — I214 Non-ST elevation (NSTEMI) myocardial infarction: Secondary | ICD-10-CM

## 2022-01-10 DIAGNOSIS — E1142 Type 2 diabetes mellitus with diabetic polyneuropathy: Secondary | ICD-10-CM

## 2022-01-10 DIAGNOSIS — R778 Other specified abnormalities of plasma proteins: Secondary | ICD-10-CM | POA: Diagnosis not present

## 2022-01-10 DIAGNOSIS — I10 Essential (primary) hypertension: Secondary | ICD-10-CM | POA: Diagnosis not present

## 2022-01-10 DIAGNOSIS — K219 Gastro-esophageal reflux disease without esophagitis: Secondary | ICD-10-CM | POA: Diagnosis not present

## 2022-01-10 DIAGNOSIS — I200 Unstable angina: Secondary | ICD-10-CM

## 2022-01-10 DIAGNOSIS — R079 Chest pain, unspecified: Secondary | ICD-10-CM | POA: Diagnosis not present

## 2022-01-10 DIAGNOSIS — D509 Iron deficiency anemia, unspecified: Secondary | ICD-10-CM | POA: Diagnosis not present

## 2022-01-10 HISTORY — PX: LEFT HEART CATH AND CORONARY ANGIOGRAPHY: CATH118249

## 2022-01-10 HISTORY — DX: Type 2 diabetes mellitus without complications: E11.9

## 2022-01-10 HISTORY — DX: Chest pain, unspecified: R07.9

## 2022-01-10 HISTORY — DX: Overweight: E66.3

## 2022-01-10 HISTORY — DX: Unspecified right bundle-branch block: I45.10

## 2022-01-10 HISTORY — DX: Iron deficiency anemia, unspecified: D50.9

## 2022-01-10 HISTORY — DX: Other forms of acute ischemic heart disease: I24.89

## 2022-01-10 HISTORY — DX: Other forms of acute ischemic heart disease: I24.8

## 2022-01-10 LAB — HIV ANTIBODY (ROUTINE TESTING W REFLEX): HIV Screen 4th Generation wRfx: NONREACTIVE

## 2022-01-10 LAB — TROPONIN I (HIGH SENSITIVITY)
Troponin I (High Sensitivity): 7 ng/L (ref <18)
Troponin I (High Sensitivity): 22 ng/L — ABNORMAL HIGH (ref <18)
Troponin I (High Sensitivity): 65 ng/L — ABNORMAL HIGH (ref <18)
Troponin I (High Sensitivity): 67 ng/L — ABNORMAL HIGH (ref <18)
Troponin I (High Sensitivity): 61 ng/L — ABNORMAL HIGH (ref <18)

## 2022-01-10 LAB — BRAIN NATRIURETIC PEPTIDE: B Natriuretic Peptide: 7.8 pg/mL (ref 0.0–100.0)

## 2022-01-10 LAB — BASIC METABOLIC PANEL
Anion gap: 6 (ref 5–15)
BUN: 15 mg/dL (ref 6–20)
CO2: 24 mmol/L (ref 22–32)
Calcium: 8.7 mg/dL — ABNORMAL LOW (ref 8.9–10.3)
Chloride: 107 mmol/L (ref 98–111)
Creatinine, Ser: 0.48 mg/dL (ref 0.44–1.00)
GFR, Estimated: >60 mL/min (ref >60)
Glucose, Bld: 147 mg/dL — ABNORMAL HIGH (ref 70–99)
Potassium: 3.7 mmol/L (ref 3.5–5.1)
Sodium: 137 mmol/L (ref 135–145)

## 2022-01-10 LAB — GLUCOSE, CAPILLARY: Glucose-Capillary: 100 mg/dL — ABNORMAL HIGH (ref 70–99)

## 2022-01-10 LAB — VITAMIN B12: Vitamin B-12: 1359 pg/mL — ABNORMAL HIGH (ref 180–914)

## 2022-01-10 LAB — HCG, QUANTITATIVE, PREGNANCY: hCG, Beta Chain, Quant, S: 1 m[IU]/mL (ref <5)

## 2022-01-10 SURGERY — LEFT HEART CATH AND CORONARY ANGIOGRAPHY
Anesthesia: Moderate Sedation

## 2022-01-10 MED ORDER — ALPRAZOLAM 0.25 MG PO TABS: 0.2500 mg | ORAL_TABLET | Freq: Two times a day (BID) | ORAL | Status: DC | PRN

## 2022-01-10 MED ORDER — VITAMIN D 25 MCG (1000 UNIT) PO TABS
2000.0000 [IU] | ORAL_TABLET | Freq: Every day | ORAL | Status: DC
  Administered 2022-01-10 – 2022-01-11 (×2): 2000 [IU] via ORAL
  Filled 2022-01-10 (×2): qty 2

## 2022-01-10 MED ORDER — FENTANYL CITRATE (PF) 100 MCG/2ML IJ SOLN
INTRAMUSCULAR | Status: AC
  Filled 2022-01-10: qty 2

## 2022-01-10 MED ORDER — MAGNESIUM HYDROXIDE 400 MG/5ML PO SUSP
30.0000 mL | Freq: Every day | ORAL | Status: DC | PRN
Start: 2022-01-10 — End: 2022-01-11

## 2022-01-10 MED ORDER — LIDOCAINE VISCOUS HCL 2 % MT SOLN: 15.0000 mL | Freq: Once | OROMUCOSAL | Status: DC

## 2022-01-10 MED ORDER — SODIUM CHLORIDE 0.9% FLUSH
3.0000 mL | Freq: Two times a day (BID) | INTRAVENOUS | Status: DC
Start: 2022-01-10 — End: 2022-01-11
  Administered 2022-01-10 (×2): 3 mL via INTRAVENOUS

## 2022-01-10 MED ORDER — TRAZODONE HCL 50 MG PO TABS: 25.0000 mg | ORAL_TABLET | Freq: Every evening | ORAL | Status: DC | PRN

## 2022-01-10 MED ORDER — SODIUM CHLORIDE 0.9 % IV SOLN: 250.0000 mL | INTRAVENOUS | Status: DC | PRN

## 2022-01-10 MED ORDER — IOHEXOL 300 MG/ML  SOLN
INTRAMUSCULAR | Status: DC | PRN
  Administered 2022-01-10: 60 mL

## 2022-01-10 MED ORDER — ACETAMINOPHEN 325 MG PO TABS: 650.0000 mg | ORAL_TABLET | ORAL | Status: DC | PRN

## 2022-01-10 MED ORDER — NITROGLYCERIN 0.4 MG SL SUBL: 0.4000 mg | SUBLINGUAL_TABLET | SUBLINGUAL | Status: DC | PRN

## 2022-01-10 MED ORDER — HEPARIN SODIUM (PORCINE) 1000 UNIT/ML IJ SOLN
INTRAMUSCULAR | Status: DC | PRN
Start: 2022-01-10 — End: 2022-01-10
  Administered 2022-01-10: 3000 [IU] via INTRAVENOUS

## 2022-01-10 MED ORDER — ENOXAPARIN SODIUM 40 MG/0.4ML IJ SOSY
40.0000 mg | PREFILLED_SYRINGE | INTRAMUSCULAR | Status: DC
  Administered 2022-01-10 – 2022-01-11 (×2): 40 mg via SUBCUTANEOUS
  Filled 2022-01-10 (×2): qty 0.4

## 2022-01-10 MED ORDER — SODIUM CHLORIDE 0.9 % WEIGHT BASED INFUSION: 1.0000 mL/kg/h | INTRAVENOUS | Status: DC

## 2022-01-10 MED ORDER — ASPIRIN 81 MG PO CHEW: 81.0000 mg | CHEWABLE_TABLET | ORAL | Status: DC

## 2022-01-10 MED ORDER — ONDANSETRON HCL 4 MG/2ML IJ SOLN: 4.0000 mg | Freq: Four times a day (QID) | INTRAMUSCULAR | Status: DC | PRN

## 2022-01-10 MED ORDER — SODIUM CHLORIDE 0.9 % IV SOLN: INTRAVENOUS | Status: DC

## 2022-01-10 MED ORDER — SODIUM CHLORIDE 0.9% FLUSH: 3.0000 mL | INTRAVENOUS | Status: DC | PRN

## 2022-01-10 MED ORDER — MIDAZOLAM HCL 2 MG/2ML IJ SOLN
INTRAMUSCULAR | Status: AC
  Filled 2022-01-10: qty 2

## 2022-01-10 MED ORDER — GABAPENTIN 300 MG PO CAPS
300.0000 mg | ORAL_CAPSULE | Freq: Every day | ORAL | Status: DC
  Administered 2022-01-10 – 2022-01-11 (×2): 300 mg via ORAL
  Filled 2022-01-10 (×2): qty 1

## 2022-01-10 MED ORDER — MORPHINE SULFATE (PF) 2 MG/ML IV SOLN: 2.0000 mg | INTRAVENOUS | Status: DC | PRN

## 2022-01-10 MED ORDER — HEPARIN (PORCINE) IN NACL 1000-0.9 UT/500ML-% IV SOLN
INTRAVENOUS | Status: AC
  Filled 2022-01-10: qty 1000

## 2022-01-10 MED ORDER — FENTANYL CITRATE (PF) 100 MCG/2ML IJ SOLN
INTRAMUSCULAR | Status: DC | PRN
  Administered 2022-01-10: 25 ug via INTRAVENOUS

## 2022-01-10 MED ORDER — ASPIRIN EC 81 MG PO TBEC
81.0000 mg | DELAYED_RELEASE_TABLET | Freq: Every day | ORAL | Status: DC
  Administered 2022-01-10 – 2022-01-11 (×2): 81 mg via ORAL
  Filled 2022-01-10 (×2): qty 1

## 2022-01-10 MED ORDER — SODIUM CHLORIDE 0.9 % WEIGHT BASED INFUSION: 3.0000 mL/kg/h | INTRAVENOUS | Status: DC

## 2022-01-10 MED ORDER — SODIUM CHLORIDE 0.9% FLUSH
3.0000 mL | Freq: Two times a day (BID) | INTRAVENOUS | Status: DC
  Administered 2022-01-10 – 2022-01-11 (×2): 3 mL via INTRAVENOUS

## 2022-01-10 MED ORDER — PANTOPRAZOLE SODIUM 40 MG PO TBEC
40.0000 mg | DELAYED_RELEASE_TABLET | Freq: Every day | ORAL | Status: DC
  Administered 2022-01-10 – 2022-01-11 (×2): 40 mg via ORAL
  Filled 2022-01-10 (×2): qty 1

## 2022-01-10 MED ORDER — HEPARIN SODIUM (PORCINE) 1000 UNIT/ML IJ SOLN
INTRAMUSCULAR | Status: AC
  Filled 2022-01-10: qty 10

## 2022-01-10 MED ORDER — ASPIRIN EC 81 MG PO TBEC: 81.0000 mg | DELAYED_RELEASE_TABLET | Freq: Every day | ORAL | 1 refills | Status: AC

## 2022-01-10 MED ORDER — HEPARIN (PORCINE) IN NACL 2000-0.9 UNIT/L-% IV SOLN
INTRAVENOUS | Status: DC | PRN
  Administered 2022-01-10: 1000 mL

## 2022-01-10 MED ORDER — ATORVASTATIN CALCIUM 20 MG PO TABS
40.0000 mg | ORAL_TABLET | Freq: Every day | ORAL | Status: DC
  Administered 2022-01-10 – 2022-01-11 (×2): 40 mg via ORAL
  Filled 2022-01-10 (×2): qty 2

## 2022-01-10 MED ORDER — MIDAZOLAM HCL 2 MG/2ML IJ SOLN
INTRAMUSCULAR | Status: DC | PRN
  Administered 2022-01-10: 1 mg via INTRAVENOUS

## 2022-01-10 MED ORDER — VERAPAMIL HCL 2.5 MG/ML IV SOLN
INTRAVENOUS | Status: DC | PRN
Start: 2022-01-10 — End: 2022-01-10
  Administered 2022-01-10: 2.5 mg via INTRA_ARTERIAL

## 2022-01-10 MED ORDER — FERROUS SULFATE 325 (65 FE) MG PO TABS
325.0000 mg | ORAL_TABLET | ORAL | Status: DC
  Administered 2022-01-10: 325 mg via ORAL
  Filled 2022-01-10: qty 1

## 2022-01-10 MED ORDER — LIDOCAINE HCL (PF) 1 % IJ SOLN
INTRAMUSCULAR | Status: DC | PRN
  Administered 2022-01-10: 2 mL

## 2022-01-10 MED ORDER — SODIUM CHLORIDE 0.9 % IV SOLN: INTRAVENOUS | Status: AC

## 2022-01-10 MED ORDER — ASPIRIN 81 MG PO CHEW
324.0000 mg | CHEWABLE_TABLET | Freq: Once | ORAL | Status: AC
  Administered 2022-01-10: 324 mg via ORAL
  Filled 2022-01-10: qty 4

## 2022-01-10 MED ORDER — VERAPAMIL HCL 2.5 MG/ML IV SOLN
INTRAVENOUS | Status: AC
  Filled 2022-01-10: qty 2

## 2022-01-10 MED ORDER — CARVEDILOL 3.125 MG PO TABS
3.1250 mg | ORAL_TABLET | Freq: Two times a day (BID) | ORAL | Status: DC
  Administered 2022-01-10 – 2022-01-11 (×3): 3.125 mg via ORAL
  Filled 2022-01-10 (×3): qty 1

## 2022-01-10 MED ORDER — LIDOCAINE HCL 1 % IJ SOLN
INTRAMUSCULAR | Status: AC
  Filled 2022-01-10: qty 20

## 2022-01-10 MED ORDER — ALUM & MAG HYDROXIDE-SIMETH 200-200-20 MG/5ML PO SUSP: 30.0000 mL | Freq: Once | ORAL | Status: DC

## 2022-01-10 SURGICAL SUPPLY — 13 items
CATH 5F 110X4 TIG (CATHETERS) ×1 IMPLANT
CATH 5FR JL3.5 JR4 ANG PIG MP (CATHETERS) ×1 IMPLANT
DEVICE RAD TR BAND REGULAR (VASCULAR PRODUCTS) ×1 IMPLANT
DRAPE BRACHIAL (DRAPES) ×1 IMPLANT
GLIDESHEATH SLEND SS 6F .021 (SHEATH) ×1 IMPLANT
GUIDEWIRE ANGLED .035 180CM (WIRE) ×1 IMPLANT
GUIDEWIRE INQWIRE 1.5J.035X260 (WIRE) IMPLANT
INQWIRE 1.5J .035X260CM (WIRE) ×4
PACK CARDIAC CATH (CUSTOM PROCEDURE TRAY) ×2 IMPLANT
PROTECTION STATION PRESSURIZED (MISCELLANEOUS) ×2
SET ATX SIMPLICITY (MISCELLANEOUS) ×1 IMPLANT
STATION PROTECTION PRESSURIZED (MISCELLANEOUS) IMPLANT
WIRE HITORQ VERSACORE ST 145CM (WIRE) ×1 IMPLANT

## 2022-01-10 NOTE — Assessment & Plan Note (Addendum)
Cardiology was consulted. ?"Chest pain/elevated troponin ?Peak troponin of 60, essentially nontrending x3 ?Nonobstructive coronary disease on catheterization yesterday ?Normal ejection fraction on echocardiogram ?No evidence of stress-induced cardiomyopathy ?Denies any recurrent symptoms, feels well ?Blood pressure stable ?Started on carvedilol low-dose, aspirin, statin" ?

## 2022-01-10 NOTE — ED Provider Notes (Addendum)
? ?[facility] ?Provider Note ? ? ? Event Date/Time  ? First MD Initiated Contact with Patient 01/10/22 [phone]   ?  (approximate) ? ? ?History  ? ?Chest Pain and Shortness of Breath ? ? ?HPI ? ?Leah Stephenson is a 51 y.o. female with history of hypertension, diabetes, hyperlipidemia who presents to the emergency department her son for intermittent right-sided chest pressure, shortness of breath.  States pain began radiating into the left side of her chest.  Not having any pain currently.  No nausea, vomiting with the chest pain but did have nausea fatigue and weakness last week.  No lower extremity swelling or pain.  No history of PE or DVT.  No previous cardiac catheterization or stress test.  Does not have a cardiologist.  Is not a smoker.  Denies any aggravating or alleviating factors to her pain.  Does not seem to be exertional, pleuritic or worse with palpation. ? ? ?History provided by patient and son. ? ? ? ?Past Medical History:  ?Diagnosis Date  ? Anemia   ? BBB (bundle branch block)   ? BBB (bundle branch block)   ? HSV (herpes simplex virus) anogenital infection   ? Hypertension   ? Pheochromocytoma   ? ? ?Past Surgical History:  ?Procedure Laterality Date  ? colonoscopy with polypectomy     ? COLONOSCOPY WITH PROPOFOL N/A 07/09/2018  ? Procedure: COLONOSCOPY WITH PROPOFOL;  Surgeon: Lollie Sails, MD;  Location: [facility] ENDOSCOPY;  Service: Endoscopy;  Laterality: N/A;  ? ESOPHAGOGASTRODUODENOSCOPY (EGD) WITH PROPOFOL N/A 07/09/2018  ? Procedure: ESOPHAGOGASTRODUODENOSCOPY (EGD) WITH PROPOFOL;  Surgeon: Lollie Sails, MD;  Location: [facility] ENDOSCOPY;  Service: Endoscopy;  Laterality: N/A;  ? Left adrenalectomy for pheochromocytoma  02/2013  ? TUBAL LIGATION    ? ? ?MEDICATIONS:  ?Prior to Admission medications   ?Medication Sig Start Date End Date Taking? Authorizing Provider  ?omeprazole (PRILOSEC) 20 MG capsule Take 20 mg by mouth daily.    [provider]  ?pantoprazole  (PROTONIX) 40 MG tablet Take 40 mg by mouth daily.    [provider]  ?ranitidine (ZANTAC) 150 MG tablet Take 150 mg by mouth daily.    [provider]  ? ? ?Physical Exam  ? ?Triage Vital Signs: ?ED Triage Vitals  ?Enc Vitals Group  ?   BP 01/09/22 2317 (!) 164/87  ?   Pulse Rate 01/09/22 2317 81  ?   Resp 01/09/22 2317 18  ?   Temp 01/09/22 2317 97.9 ?F (36.6 ?C)  ?   Temp Source 01/09/22 2317 Oral  ?   SpO2 01/09/22 2317 93 %  ?   Weight 01/09/22 2318 145 lb (65.8 kg)  ?   Height 01/09/22 2318 '4\' 11"'$  (1.499 m)  ?   Head Circumference --   ?   Peak Flow --   ?   Pain Score 01/09/22 2318 7  ?   Pain Loc --   ?   Pain Edu? --   ?   Excl. in [place]? --   ? ? ?Most recent vital signs: ?Vitals:  ? 01/09/22 2317  ?BP: (!) 164/87  ?Pulse: 81  ?Resp: 18  ?Temp: 97.9 ?F (36.6 ?C)  ?SpO2: 93%  ? ? ?CONSTITUTIONAL: Alert and oriented and responds appropriately to questions. Well-appearing; well-nourished ?HEAD: Normocephalic, atraumatic ?EYES: Conjunctivae clear, pupils appear equal, sclera nonicteric ?ENT: normal nose; moist mucous membranes ?NECK: Supple, normal ROM ?CARD: RRR; S1 and S2 appreciated; no murmurs, no  clicks, no rubs, no gallops ?CHEST:  Chest wall is nontender to palpation.  No crepitus, ecchymosis, erythema, warmth, rash or other lesions present.   ?RESP: Normal chest excursion without splinting or tachypnea; breath sounds clear and equal bilaterally; no wheezes, no rhonchi, no rales, no hypoxia or respiratory distress, speaking full sentences ?ABD/GI: Normal bowel sounds; non-distended; soft, non-tender, no rebound, no guarding, no peritoneal signs ?BACK: The back appears normal no midline spinal tenderness or step-off or deformity, no tenderness over the trapezius muscles bilaterally, no rash, no soft tissue swelling, no ecchymosis ?EXT: Normal ROM in all joints; no deformity noted, no edema; no cyanosis, no calf tenderness or calf swelling ?SKIN: Normal color for age and race; warm; no  rash on exposed skin ?NEURO: Moves all extremities equally, normal speech ?PSYCH: The patient's mood and manner are appropriate. ? ? ?ED Results / Procedures / Treatments  ? ?LABS: ?(all labs ordered are listed, but only abnormal results are displayed) ?Labs Reviewed  ?BASIC METABOLIC PANEL - Abnormal; Notable for the following components:  ?    Result Value  ? Glucose, Bld 147 (*)   ? Calcium 8.7 (*)   ? All other components within normal limits  ?CBC - Abnormal; Notable for the following components:  ? Hemoglobin 10.2 (*)   ? HCT 33.1 (*)   ? MCV 77.3 (*)   ? MCH 23.8 (*)   ? RDW 17.4 (*)   ? All other components within normal limits  ?TROPONIN I (HIGH SENSITIVITY) - Abnormal; Notable for the following components:  ? Troponin I (High Sensitivity) 22 (*)   ? All other components within normal limits  ?BRAIN NATRIURETIC PEPTIDE  ?HCG, QUANTITATIVE, PREGNANCY  ?TROPONIN I (HIGH SENSITIVITY)  ? ? ? ?EKG: ? EKG Interpretation ? ?Date/Time:  Thursday Jan 09 2022 23:14:39 EDT ?Ventricular Rate:  74 ?PR Interval:  154 ?QRS Duration: 128 ?QT Interval:  410 ?QTC Calculation: 455 ?R Axis:   -7 ?Text Interpretation: Normal sinus rhythm Right bundle branch block Abnormal ECG When compared with ECG of 22-Aug-2010 11:49, No significant change was found Confirmed by Pryor Curia [phone]) on 01/10/2022 2:22:02 AM ?  ? ?  ? ? ? ?RADIOLOGY: ?My personal review and interpretation of imaging: Chest x-ray clear. ? ?I have personally reviewed all radiology reports.   ?DG Chest 2 View ? ?Result Date: 01/09/2022 ?CLINICAL DATA:  Chest pain and shortness of breath. EXAM: CHEST - 2 VIEW COMPARISON:  Chest radiograph dated 08/22/2010 FINDINGS: The heart size and mediastinal contours are within normal limits. Both lungs are clear. The visualized skeletal structures are unremarkable. IMPRESSION: No active cardiopulmonary disease. Electronically Signed   By: Anner Crete M.D.   On: 01/09/2022 23:36   ? ? ?PROCEDURES: ? ?Critical Care performed:  Yes, see critical care procedure note(s) ? ? ?CRITICAL CARE ?Performed by: Cyril Mourning Eric Nees ? ? ?Total critical care time: 35 minutes ? ?Critical care time was exclusive of separately billable procedures and treating other patients. ? ?Critical care was necessary to treat or prevent imminent or life-threatening deterioration. ? ?Critical care was time spent personally by me on the following activities: development of treatment plan with patient and/or surrogate as well as nursing, discussions with consultants, evaluation of patient's response to treatment, examination of patient, obtaining history from patient or surrogate, ordering and performing treatments and interventions, ordering and review of laboratory studies, ordering and review of radiographic studies, pulse oximetry and re-evaluation of patient's condition. ? ? ?.1-3 Lead EKG Interpretation ?Performed by:  Lamarr Feenstra, Delice Bison, DO ?Authorized by: Shannyn Jankowiak, Delice Bison, DO  ? ?  Interpretation: normal   ?  ECG rate:  81 ?  ECG rate assessment: normal   ?  Rhythm: sinus rhythm   ?  Ectopy: none   ?  Conduction: normal   ? ? ? ?IMPRESSION / MDM / ASSESSMENT AND PLAN / ED COURSE  ?I reviewed the triage vital signs and the nursing notes. ? ? ? ?Patient here with complaints of chest pain, shortness of breath.  Does have some risk factors for ACS but some of the symptoms seem atypical as the pain is right-sided.  Currently asymptomatic. ? ?The patient is on the cardiac monitor to evaluate for evidence of arrhythmia and/or significant heart rate changes. ? ? ?DIFFERENTIAL DIAGNOSIS (includes but not limited to):   ACS, PE, dissection, pneumothorax, CHF, pneumonia, musculoskeletal pain ? ? ?PLAN: We will obtain CBC, BMP, troponin x2, EKG, chest x-ray.  We will continue to closely monitor. ? ? ?MEDICATIONS GIVEN IN ED: ?Medications  ?aspirin chewable tablet 324 mg (324 mg Oral Given 01/10/22 0314)  ? ? ? ?ED COURSE: Patient's labs show no leukocytosis.  Mild anemia with hemoglobin  of 10.2.  Normal electrolytes.  First troponin negative.  BNP normal.  Chest x-ray reviewed by myself and radiologist and shows no infiltrate, edema or pneumothorax.  EKG nonischemic.  Second troponin pe

## 2022-01-10 NOTE — Consult Note (Signed)
? ?Cardiology Consult  ?  ?Patient ID: Leah Stephenson ?MRN: [id number], DOB/AGE: 12/27/1970  ? ?Admit date: 01/10/2022 ?Date of Consult: 01/10/2022 ? ?Primary Physician: [place] [place] ?Primary Cardiologist: Kathlyn Sacramento, MD - new ?Requesting Provider: Darlyne Russian, DO ? ?Patient Profile  ?  ?Leah Stephenson is a 51 y.o. female with a history of HTN, DMII, pheochromocytoma s/p L adrenalectomy (2014), microcytic anemia, RBBB, and HSV, who is being seen today for the evaluation of chest pain and mild HsTrop elevation at the request of Dr. Arbutus Ped. ? ?Past Medical History  ? ?Past Medical History:  ?Diagnosis Date  ? HSV (herpes simplex virus) anogenital infection   ? Hypertension   ? Microcytic anemia   ? Overweight (BMI 25.0-29.9)   ? Pheochromocytoma   ? RBBB   ? Type II diabetes mellitus ([place])   ?  ?Past Surgical History:  ?Procedure Laterality Date  ? colonoscopy with polypectomy     ? COLONOSCOPY WITH PROPOFOL N/A 07/09/2018  ? Procedure: COLONOSCOPY WITH PROPOFOL;  Surgeon: Lollie Sails, MD;  Location: [facility] ENDOSCOPY;  Service: Endoscopy;  Laterality: N/A;  ? ESOPHAGOGASTRODUODENOSCOPY (EGD) WITH PROPOFOL N/A 07/09/2018  ? Procedure: ESOPHAGOGASTRODUODENOSCOPY (EGD) WITH PROPOFOL;  Surgeon: Lollie Sails, MD;  Location: [facility] ENDOSCOPY;  Service: Endoscopy;  Laterality: N/A;  ? Left adrenalectomy for pheochromocytoma  02/2013  ? TUBAL LIGATION    ?  ? ?Allergies ? ?No Known Allergies ? ?History of Present Illness  ?  ?51 y.o. female with a history of HTN, DMII, pheochromocytoma s/p L adrenalectomy (2014), microcytic anemia, RBBB, and HSV.  She has no prior cardiac history.  She lives locally w/ her husband and works in a [employer].  She does not routinely exercise but notes that she does exert herself and is required to lift quite a bit at work.  She usually able to do this without difficulty.  She notes that she was diagnosed with diabetes about 8 to 9 years ago and was initially on  medication but then this was discontinued.  Metformin was resumed about a year ago.  She was in her usual state of health until about 1 week ago, when she noted severe fatigue associated with paresthesias in her bilateral hands and left shoulder.  1 day last week, while working, she noted significant redness of bilateral hands.  This redness resolved within 24 hours however, she has since had hyperpigmentation over her knuckles.  Fatigue and paresthesias resolved 1 week ago today, after about 5 days of symptoms.  She felt well earlier this week.  On May 4, she returned home from work and was feeling well, and left for church around 6:30 PM.  While at church, she began to experience substernal chest tightness that radiated through to her back and became associate with mild dyspnea.  This persisted throughout the church service and she noted some worsening of the tightness and dyspnea when walking back to her car.  She returned home and continued to have symptoms.  When she mentioned it to her son, they decided to come into the emergency department.  She did take aspirin and upon arrival here, she initially felt better but then had recurrent symptoms while in the waiting room and thus stayed.  ECG here shows sinus rhythm with a right bundle branch block, which is old.  Troponin was initially normal at 7 though a follow-up troponin at 216 this morning was mildly elevated at 22.  She is due to have  another troponin drawn now.  Currently, she reports mild burning in her chest but has had no further tightness or dyspnea.  She denies palpitations, PND, orthopnea, dizziness, syncope, edema, or early satiety.  Her husband is at the bedside. ? ?Inpatient Medications  ?  ? alum & mag hydroxide-simeth  30 mL Oral Once  ? And  ? lidocaine  15 mL Oral Once  ? aspirin EC  81 mg Oral Daily  ? cholecalciferol  2,000 Units Oral Daily  ? enoxaparin (LOVENOX) injection  40 mg Subcutaneous Q24H  ? ferrous sulfate  325 mg Oral QODAY  ?  gabapentin  300 mg Oral Daily  ? ? ?Family History  ?  ?Family History  ?Problem Relation Age of Onset  ? Diabetes Mother   ? Breast cancer Neg Hx   ? ?She indicated that her mother is alive. She indicated that her father is alive. She indicated that the status of her neg hx is unknown. ? ? ?Social History  ?  ?Social History  ? ?Socioeconomic History  ? Marital status: Married  ?  Spouse name: Not on file  ? Number of children: Not on file  ? Years of education: Not on file  ? Highest education level: Not on file  ?Occupational History  ? Not on file  ?Tobacco Use  ? Smoking status: Never  ? Smokeless tobacco: Not on file  ?Substance and Sexual Activity  ? Alcohol use: Not Currently  ? Drug use: Never  ? Sexual activity: Not on file  ?Other Topics Concern  ? Not on file  ?Social History Narrative  ? Lives locally with husband.  3 grown children.  Works at [employer].  Does not routinely exercise.  ? ?Social Determinants of Health  ? ?Financial Resource Strain: Not on file  ?Food Insecurity: Not on file  ?Transportation Needs: Not on file  ?Physical Activity: Not on file  ?Stress: Not on file  ?Social Connections: Not on file  ?Intimate Partner Violence: Not on file  ?  ? ?Review of Systems  ?  ?General:  +++ Generalized fatigue and malaise with paresthesias in her left shoulder and bilateral hands last week.  Episode of bilateral hand redness with subsequent hyperpigmentation over her knuckles.  No chills, fever, night sweats or weight changes.  ?Cardiovascular:  +++ chest pain tightness, +++ dyspnea on exertion associated with chest tightness last night, no edema, orthopnea, palpitations, paroxysmal nocturnal dyspnea. ?Dermatological: No rash, lesions/masses ?Respiratory: No cough, +++ dyspnea ?Urologic: No hematuria, dysuria ?Abdominal:   No nausea, vomiting, diarrhea, bright red blood per rectum, melena, or hematemesis ?Neurologic:  +++ Bilateral hand and left shoulder paresthesias for about 5 days  last week, this is since resolved.  No visual changes, changes in mental status. ?All other systems reviewed and are otherwise negative except as noted above. ? ?Physical Exam  ?  ?Blood pressure (!) 147/92, pulse 76, temperature 98.1 ?F (36.7 ?C), temperature source Oral, resp. rate 18, height '4\' 11"'$  (1.499 m), weight 65.8 kg, last menstrual period 12/26/2021, SpO2 100 %.  ?General: Pleasant, NAD ?Psych: Normal affect. ?Neuro: Alert and oriented X 3. Moves all extremities spontaneously. ?HEENT: Normal  ?Neck: Supple without bruits or JVD. ?Lungs:  Resp regular and unlabored, CTA. ?Heart: RRR no s3, s4.  2/6 systolic murmur heard throughout. ?Abdomen: Soft, non-tender, non-distended, BS + x 4.  ?Extremities: No clubbing, cyanosis or edema. DP/PT2+, Radials 2+ and equal bilaterally.  Hyperpigmentation noted over bilateral hands. ? ?Labs  ?  ?  Cardiac Enzymes ?Recent Labs  ?Lab 01/09/22 ?2320 01/10/22 ?0216  ?TROPONINIHS 7 22*  ?   ?BNP ?   ?Component Value Date/Time  ? BNP 7.8 01/09/2022 2320  ? ? ?Lab Results  ?Component Value Date  ? WBC 7.3 01/09/2022  ? HGB 10.2 (L) 01/09/2022  ? HCT 33.1 (L) 01/09/2022  ? MCV 77.3 (L) 01/09/2022  ? PLT 377 01/09/2022  ?  ?Recent Labs  ?Lab 01/09/22 ?2320  ?NA 137  ?K 3.7  ?CL 107  ?CO2 24  ?BUN 15  ?CREATININE 0.48  ?CALCIUM 8.7*  ?GLUCOSE 147*  ? ?  ?Radiology Studies  ?  ?DG Chest 2 View ? ?Result Date: 01/09/2022 ?CLINICAL DATA:  Chest pain and shortness of breath. EXAM: CHEST - 2 VIEW COMPARISON:  Chest radiograph dated 08/22/2010 FINDINGS: The heart size and mediastinal contours are within normal limits. Both lungs are clear. The visualized skeletal structures are unremarkable. IMPRESSION: No active cardiopulmonary disease. Electronically Signed   By: Anner Crete M.D.   On: 01/09/2022 23:36   ? ?ECG & Cardiac Imaging  ?  ?RSR, 74, RBBB - personally reviewed. ? ?Assessment & Plan  ?  ?1.  Unstable angina/elevated troponin: Patient without prior cardiac history or family  history for premature CAD.  Last week, she began to experience fatigue and malaise associated with left shoulder and bilateral hand paresthesias.  This resolved after about 5 days and she had been doing

## 2022-01-10 NOTE — Plan of Care (Signed)
Cardio notified of trop result = 65.  Plan is to go to cath lab, later today.  Family informed and aware. ?

## 2022-01-10 NOTE — Discharge Instructions (Addendum)
**  PLEASE REMEMBER TO BRING ALL OF YOUR MEDICATIONS TO EACH OF YOUR FOLLOW-UP OFFICE VISITS. ° °NO HEAVY LIFTING X 2 WEEKS. °NO SEXUAL ACTIVITY X 2 WEEKS. °NO DRIVING X 1 WEEK. °NO SOAKING BATHS, HOT TUBS, POOLS, ETC., X 7 DAYS. ° °Radial Site Care °Refer to this sheet in the next few weeks. These instructions provide you with information on caring for yourself after your procedure. Your caregiver may also give you more specific instructions. Your treatment has been planned according to current medical practices, but problems sometimes occur. Call your caregiver if you have any problems or questions after your procedure. °HOME CARE INSTRUCTIONS °· You may shower the day after the procedure. Remove the bandage (dressing) and gently wash the site with plain soap and water. Gently pat the site dry.  °· Do not apply powder or lotion to the site.  °· Do not submerge the affected site in water for 3 to 5 days.  °· Inspect the site at least twice daily.  °· Do not flex or bend the affected arm for 24 hours.  °· No lifting over 5 pounds (2.3 kg) for 5 days after your procedure.  °· Do not drive home if you are discharged the same day of the procedure. Have someone else drive you.  ° °What to expect: °· Any bruising will usually fade within 1 to 2 weeks.  °· Blood that collects in the tissue (hematoma) may be painful to the touch. It should usually decrease in size and tenderness within 1 to 2 weeks.  °SEEK IMMEDIATE MEDICAL CARE IF: °· You have unusual pain at the radial site.  °· You have redness, warmth, swelling, or pain at the radial site.  °· You have drainage (other than a small amount of blood on the dressing).  °· You have chills.  °· You have a fever or persistent symptoms for more than 72 hours.  °· You have a fever and your symptoms suddenly get worse.  °· Your arm becomes pale, cool, tingly, or numb.  °· You have heavy bleeding from the site. Hold pressure on the site.  ° °  ° °10 Habits of Highly Healthy  People ° °[facility] wants to help you get well and stay well.  Live a longer, healthier life by practicing healthy habits every day. ° °1.  Visit your primary care provider regularly. °2.  Make time for family and friends.  Healthy relationships are important. °3.  Take medications as directed by your provider. °4.  Maintain a healthy weight and a trim waistline. °5.  Eat healthy meals and snacks, rich in fruits, vegetables, whole grains, and lean proteins. °6.  Get moving every day - aim for 150 minutes of moderate physical activity each week. °7.  Don't smoke. °8.  Avoid alcohol or drink in moderation. °9.  Manage stress through meditation or mindful relaxation. °10.  Get seven to nine hours of quality sleep each night. ° °Want more information on healthy habits?  To learn more about these and other healthy habits, visit [email or web address]. °_____________ °  °  °

## 2022-01-10 NOTE — Assessment & Plan Note (Addendum)
on PPI therapy. 

## 2022-01-10 NOTE — Progress Notes (Signed)
?  Progress Note ? ? ?Patient: Leah Stephenson PNT:[id number] DOB: Jul 27, 1971 DOA: 01/10/2022     0 ?DOS: the patient was seen and examined on 01/10/2022 ?  ?Brief hospital course: ?HPI on admission: "51 y.o. Hispanic female with medical history significant for hypertension, type 2 diabetes mellitus and pheochromocytoma as well as anemia, who presented to the ER with a [place] of chest pain felt as pressure and graded 9/10 in severity that started on the right side and extended to her left chest with associated nausea without vomiting or diaphoresis.  She denied any cough or wheezing or hemoptysis.  No leg pain or edema or recent travels or surgeries.  No fever or chills.  No bleeding diathesis.  She denies any dysuria, oliguria or hematuria or flank pain.  She was given 4 baby aspirin and her pain was better. ?  ?ED Course: When she came to the ER BP was 164/87 with otherwise normal vital signs.  Later on BP was 147/92.  Labs revealed mild anemia close to previous levels and BMP was unremarkable.  High-sensitivity troponin was 7 and later 22 and BNP was 7.8.  Quantitative beta-hCG was negative ?EKG as reviewed by me : EKG showed sinus rhythm with a rate of 74 with right bundle branch block. ?Imaging: Two-view chest x-ray showed no acute cardiopulmonary disease. ? ?The patient was given 4 baby aspirin.  She will be admitted to observation cardiac telemetry bed for further evaluation and management.  " ? ? ?5/5: Cardiology took patient to the Cath Lab where no significant coronary disease was seen.  Observing patient overnight and echocardiogram is pending. ? ?Same day as admission rounding note. ?Please see H&P for full assessment and plan. ?Also see cardiology recommendations. ? ?Assessment and Plan: ?I have reviewed the assessment and plan in H&P by Dr. Sidney Ace, and agree with any changes or exceptions as below. ? ? ?Cardiac cath report reviewed.  No significant obstructive coronary artery disease was found, LV systolic  function appeared normal and no clear wall motion abnormalities.  Echocardiogram is pending. ? ? ? ? ? ? ? ?  ? ?Subjective: Patient seen briefly in the ED prior to going to Cath Lab.  No acute complaints at the time ? ?Physical Exam: ?Vitals:  ? 01/10/22 1330 01/10/22 1345 01/10/22 1400 01/10/22 1430  ?BP: 120/75 118/70 116/75 103/72  ?Pulse: 75 63 67 71  ?Resp: '20 16 14 17  '$ ?Temp:      ?TempSrc:      ?SpO2: 90% 93% 94% 93%  ?Weight:      ?Height:      ? ?General exam: awake, alert, no acute distress ?Respiratory system: normal respiratory effort. On room air ?Cardiovascular system: RRR, no edema   ?Central nervous system: Alert, oriented, no gross focal neurologic deficits ?Skin: dry, intact, no rashes seen ?Psychiatry: normal mood, congruent affect ? ? ?Data Reviewed: ? ?Notable labs: Troponin increased from 22 up to 65, vitamin B12 elevated 1359 ? ?Family Communication: At bedside during encounter ? ?Disposition: ?Status is: Observation ?The patient remains OBS appropriate and will d/c before 2 midnights. ? Planned Discharge Destination: Home ? ? ? ?No charge ? ?Author: ?Ezekiel Slocumb, DO ?01/10/2022 2:55 PM ? ?For on call review [email or web address].  ?

## 2022-01-10 NOTE — Assessment & Plan Note (Signed)
-   We will continue ferrous sulfate. ?

## 2022-01-10 NOTE — H&P (Addendum)
?  ?  ?[place] ? ? ?PATIENT NAME: Leah Stephenson   ? ?MR#:  [id number] ? ?DATE OF BIRTH:  1971/04/09 ? ?DATE OF ADMISSION:  01/10/2022 ? ?PRIMARY CARE PHYSICIAN: [place]  ? ?Patient is coming from: Home ? ?REQUESTING/REFERRING PHYSICIAN: Ward, Delice Bison, DO ? ?CHIEF COMPLAINT:  ? ?Chief Complaint  ?Patient presents with  ? Chest Pain  ? Shortness of Breath  ? ? ?HISTORY OF PRESENT ILLNESS:  ?Leah Stephenson is a 51 y.o. Hispanic female with medical history significant for hypertension, type 2 diabetes mellitus and pheochromocytoma as well as anemia, who presented to the ER with a Kalisetti of chest pain felt as pressure and graded 9/10 in severity that started on the right side and extended to her left chest with associated nausea without vomiting or diaphoresis.  She denied any cough or wheezing or hemoptysis.  No leg pain or edema or recent travels or surgeries.  No fever or chills.  No bleeding diathesis.  She denies any dysuria, oliguria or hematuria or flank pain.  She was given 4 baby aspirin and her pain was better. ? ?ED Course: When she came to the ER BP was 164/87 with otherwise normal vital signs.  Later on BP was 147/92.  Labs revealed mild anemia close to previous levels and BMP was unremarkable.  High-sensitivity troponin was 7 and later 22 and BNP was 7.8.  Quantitative beta-hCG was negative ?EKG as reviewed by me : EKG showed sinus rhythm with a rate of 74 with right bundle branch block. ?Imaging: Two-view chest x-ray showed no acute cardiopulmonary disease. ? ?The patient was given 4 baby aspirin.  She will be admitted to observation cardiac telemetry bed for further evaluation and management.   ?PAST MEDICAL HISTORY:  ? ?Past Medical History:  ?Diagnosis Date  ? Anemia   ? BBB (bundle branch block)   ? BBB (bundle branch block)   ? HSV (herpes simplex virus) anogenital infection   ? Hypertension   ? Pheochromocytoma   ?Type 2 diabetes mellitus ? ?PAST SURGICAL HISTORY:  ? ?Past  Surgical History:  ?Procedure Laterality Date  ? colonoscopy with polypectomy     ? COLONOSCOPY WITH PROPOFOL N/A 07/09/2018  ? Procedure: COLONOSCOPY WITH PROPOFOL;  Surgeon: Lollie Sails, MD;  Location: [facility] ENDOSCOPY;  Service: Endoscopy;  Laterality: N/A;  ? ESOPHAGOGASTRODUODENOSCOPY (EGD) WITH PROPOFOL N/A 07/09/2018  ? Procedure: ESOPHAGOGASTRODUODENOSCOPY (EGD) WITH PROPOFOL;  Surgeon: Lollie Sails, MD;  Location: [facility] ENDOSCOPY;  Service: Endoscopy;  Laterality: N/A;  ? Left adrenalectomy for pheochromocytoma  02/2013  ? TUBAL LIGATION    ? ? ?SOCIAL HISTORY:  ? ?Social History  ? ?Tobacco Use  ? Smoking status: Never  ? Smokeless tobacco: Not on file  ?Substance Use Topics  ? Alcohol use: Not Currently  ? ? ?FAMILY HISTORY:  ? ?Family History  ?Problem Relation Age of Onset  ? Breast cancer Neg Hx   ? ? ?DRUG ALLERGIES:  ?No Known Allergies ? ?REVIEW OF SYSTEMS:  ? ?ROS ?As per history of present illness. All pertinent systems were reviewed above. Constitutional, HEENT, cardiovascular, respiratory, GI, GU, musculoskeletal, neuro, psychiatric, endocrine, integumentary and hematologic systems were reviewed and are otherwise negative/unremarkable except for positive findings mentioned above in the HPI. ? ? ?MEDICATIONS AT HOME:  ? ?Prior to Admission medications   ?Medication Sig Start Date End Date Taking? Authorizing Provider  ?aspirin EC 81 MG tablet Take 1 tablet (81 mg total) by mouth daily.  Swallow whole. 01/10/22 03/11/22 Yes Ward, Delice Bison, DO  ?omeprazole (PRILOSEC) 20 MG capsule Take 20 mg by mouth daily.    [provider]  ?pantoprazole (PROTONIX) 40 MG tablet Take 40 mg by mouth daily.    [provider]  ?ranitidine (ZANTAC) 150 MG tablet Take 150 mg by mouth daily.    [provider]  ? ?  ? ?VITAL SIGNS:  ?Blood pressure (!) 147/92, pulse 76, temperature 98.1 ?F (36.7 ?C), temperature source Oral, resp. rate 18, height '4\' 11"'$  (1.499 m), weight 65.8 kg, last  menstrual period 12/26/2021, SpO2 100 %. ? ?PHYSICAL EXAMINATION:  ?Physical Exam ? ?GENERAL:  51 y.o.-year-old Hispanic patient lying in the bed with no acute distress.  ?EYES: Pupils equal, round, reactive to light and accommodation. No scleral icterus. Extraocular muscles intact.  ?HEENT: Head atraumatic, normocephalic. Oropharynx and nasopharynx clear.  ?NECK:  Supple, no jugular venous distention. No thyroid enlargement, no tenderness.  ?LUNGS: Normal breath sounds bilaterally, no wheezing, rales,rhonchi or crepitation. No use of accessory muscles of respiration.  ?CARDIOVASCULAR: Regular rate and rhythm, S1, S2 normal. No murmurs, rubs, or gallops.  ?ABDOMEN: Soft, nondistended, nontender. Bowel sounds present. No organomegaly or mass.  ?EXTREMITIES: No pedal edema, cyanosis, or clubbing.  ?NEUROLOGIC: Cranial nerves II through XII are intact. Muscle strength 5/5 in all extremities. Sensation intact. Gait not checked.  ?PSYCHIATRIC: The patient is alert and oriented x 3.  Normal affect and good eye contact. ?SKIN: No obvious rash, lesion, or ulcer.  ? ?LABORATORY PANEL:  ? ?CBC ?Recent Labs  ?Lab 01/09/22 ?2320  ?WBC 7.3  ?HGB 10.2*  ?HCT 33.1*  ?PLT 377  ? ?------------------------------------------------------------------------------------------------------------------ ? ?Chemistries  ?Recent Labs  ?Lab 01/09/22 ?2320  ?NA 137  ?K 3.7  ?CL 107  ?CO2 24  ?GLUCOSE 147*  ?BUN 15  ?CREATININE 0.48  ?CALCIUM 8.7*  ? ?------------------------------------------------------------------------------------------------------------------ ? ?Cardiac Enzymes ?No results for input(s): TROPONINI in the last 168 hours. ?------------------------------------------------------------------------------------------------------------------ ? ?RADIOLOGY:  ?DG Chest 2 View ? ?Result Date: 01/09/2022 ?CLINICAL DATA:  Chest pain and shortness of breath. EXAM: CHEST - 2 VIEW COMPARISON:  Chest radiograph dated 08/22/2010 FINDINGS: The  heart size and mediastinal contours are within normal limits. Both lungs are clear. The visualized skeletal structures are unremarkable. IMPRESSION: No active cardiopulmonary disease. Electronically Signed   By: Anner Crete M.D.   On: 01/09/2022 23:36   ? ? ? ?IMPRESSION AND PLAN:  ?Assessment and Plan: ?* Chest pain ?- The patient will be admitted to an observation cardiac telemetry bed. ?- We will follow serial troponins especially given increased delta. ?- She will be placed on as needed sublingual nitroglycerin placed on aspirin. ?- Cardiology consult will be obtained for patient. ?- I notified Dr. Radford Pax about the patient. ? ?Type 2 diabetes mellitus with peripheral neuropathy ([place]) ?- The patient will be placed on supplement coverage with NovoLog. ?- We will hold off metformin. ?- We will continue Neurontin. ? ?GERD without esophagitis ?- We will continue PPI therapy. ? ?Iron deficiency anemia ?- We will continue ferrous sulfate. ? ? ? ? ?  ?DVT prophylaxis: Lovenox.  ?Advanced Care Planning:  Code Status: full code.  ?Family Communication:  The plan of care was discussed in details with the patient (and family). ?I answered all questions. The patient agreed to proceed with the above mentioned plan. Further management will depend upon hospital course. ?Disposition Plan: Back to previous home environment ?Consults called: Cardiology. ?All the records are reviewed and case discussed  with ED provider. ? ?Status is: Observation ? ? ?I certify that at the time of admission, it is my clinical judgment that the patient will require  hospital care extending less than 2 midnights. ?              ?             Dispo: The patient is from: Home ?             Anticipated d/c is to: Home ?             Patient currently is not medically stable to d/c. ?             Difficult to place patient: No ? ?Christel Mormon M.D on 01/10/2022 at 6:18 AM ? ?[facility]  ? ?From 7 PM-7 AM, contact  night-coverage ?[email or web address] ? ?CC: Primary care physician; [place] ? ?

## 2022-01-10 NOTE — Assessment & Plan Note (Addendum)
Placed on supplement coverage with NovoLog. ?Held off metformin. ?Continue Neurontin. ?

## 2022-01-10 NOTE — Hospital Course (Addendum)
HPI on admission: "51 y.o. Hispanic female with medical history significant for hypertension, type 2 diabetes mellitus and pheochromocytoma as well as anemia, who presented to the ER with a Kalisetti of chest pain felt as pressure and graded 9/10 in severity that started on the right side and extended to her left chest with associated nausea without vomiting or diaphoresis.  She denied any cough or wheezing or hemoptysis.  No leg pain or edema or recent travels or surgeries.  No fever or chills.  No bleeding diathesis.  She denies any dysuria, oliguria or hematuria or flank pain.  She was given 4 baby aspirin and her pain was better. ?  ?ED Course: When she came to the ER BP was 164/87 with otherwise normal vital signs.  Later on BP was 147/92.  Labs revealed mild anemia close to previous levels and BMP was unremarkable.  High-sensitivity troponin was 7 and later 22 and BNP was 7.8.  Quantitative beta-hCG was negative ?EKG as reviewed by me : EKG showed sinus rhythm with a rate of 74 with right bundle branch block. ?Imaging: Two-view chest x-ray showed no acute cardiopulmonary disease. ? ?The patient was given 4 baby aspirin.  She will be admitted to observation cardiac telemetry bed for further evaluation and management.  " ? ? ?5/5: Cardiology took patient to the Cath Lab where no significant coronary disease was seen.  Observing patient overnight and echocardiogram is pending. ? ?5/6: patient has remained chest pain free.  Stable for discharge today. ?

## 2022-01-11 ENCOUNTER — Observation Stay (HOSPITAL_BASED_OUTPATIENT_CLINIC_OR_DEPARTMENT_OTHER)
Admit: 2022-01-11 | Discharge: 2022-01-11 | Disposition: A | Discharge status: Home / Self Care | Payer: 59 | Attending: Cardiovascular Disease | Admitting: Cardiovascular Disease

## 2022-01-11 DIAGNOSIS — R079 Chest pain, unspecified: Secondary | ICD-10-CM | POA: Diagnosis not present

## 2022-01-11 DIAGNOSIS — R011 Cardiac murmur, unspecified: Secondary | ICD-10-CM | POA: Diagnosis not present

## 2022-01-11 DIAGNOSIS — E1142 Type 2 diabetes mellitus with diabetic polyneuropathy: Secondary | ICD-10-CM | POA: Diagnosis not present

## 2022-01-11 DIAGNOSIS — E782 Mixed hyperlipidemia: Secondary | ICD-10-CM

## 2022-01-11 DIAGNOSIS — R0789 Other chest pain: Secondary | ICD-10-CM | POA: Diagnosis not present

## 2022-01-11 DIAGNOSIS — I200 Unstable angina: Secondary | ICD-10-CM | POA: Diagnosis not present

## 2022-01-11 LAB — ECHOCARDIOGRAM COMPLETE
AR max vel: 1.58 cm2
AV Area VTI: 1.83 cm2
AV Area mean vel: 1.83 cm2
AV Mean grad: 5.5 mmHg
AV Peak grad: 11.7 mmHg
Ao pk vel: 1.71 m/s
Area-P 1/2: 2.83 cm2
Calc EF: 82.4 %
Height: 59 in
S' Lateral: 3.1 cm
Single Plane A2C EF: 81.2 %
Single Plane A4C EF: 82.6 %
Weight: 2320 oz

## 2022-01-11 LAB — BASIC METABOLIC PANEL
Anion gap: 5 (ref 5–15)
BUN: 10 mg/dL (ref 6–20)
CO2: 23 mmol/L (ref 22–32)
Calcium: 8.2 mg/dL — ABNORMAL LOW (ref 8.9–10.3)
Chloride: 110 mmol/L (ref 98–111)
Creatinine, Ser: 0.55 mg/dL (ref 0.44–1.00)
GFR, Estimated: >60 mL/min (ref >60)
Glucose, Bld: 106 mg/dL — ABNORMAL HIGH (ref 70–99)
Potassium: 3.8 mmol/L (ref 3.5–5.1)
Sodium: 138 mmol/L (ref 135–145)

## 2022-01-11 LAB — LIPID PANEL
Cholesterol: 182 mg/dL (ref 0–200)
HDL: 54 mg/dL (ref >40)
LDL Cholesterol: 113 mg/dL — ABNORMAL HIGH (ref 0–99)
Total CHOL/HDL Ratio: 3.4 RATIO
Triglycerides: 75 mg/dL (ref <150)
VLDL: 15 mg/dL (ref 0–40)

## 2022-01-11 MED ORDER — PANTOPRAZOLE SODIUM 40 MG PO TBEC
40.0000 mg | DELAYED_RELEASE_TABLET | Freq: Every day | ORAL | 1 refills | Status: AC
Start: 2022-01-12 — End: ?

## 2022-01-11 MED ORDER — ATORVASTATIN CALCIUM 40 MG PO TABS: 40.0000 mg | ORAL_TABLET | Freq: Every day | ORAL | 1 refills | Status: DC

## 2022-01-11 MED ORDER — CARVEDILOL 3.125 MG PO TABS: 3.1250 mg | ORAL_TABLET | Freq: Two times a day (BID) | ORAL | 1 refills | Status: AC

## 2022-01-11 NOTE — Progress Notes (Signed)
?  [address] ?[place], [place] [address] ?[phone] ? ?Jan 11, 2022 ? ? ? ?To whom it may concern, ? ?Please excuse Ms. Dolloff from work from 01/10/2022 through 01/19/2022. ? ?Sincerely, ? ? ? ?Murray Hodgkins, NP ?   ?

## 2022-01-11 NOTE — Plan of Care (Signed)

## 2022-01-11 NOTE — Progress Notes (Addendum)
? ?Cardiology Progress Note  ? ?Patient Name: Leah Stephenson ?Date of Encounter: 01/11/2022 ? ?Primary Cardiologist: Kathlyn [place], MD ? ?Subjective  ? ?Feels well this AM.  Currently undergoing echo.  No chest pain or sob.  R wrist feels well. ? ?Inpatient Medications  ?  ?Scheduled Meds: ? alum & mag hydroxide-simeth  30 mL Oral Once  ? And  ? lidocaine  15 mL Oral Once  ? aspirin EC  81 mg Oral Daily  ? atorvastatin  40 mg Oral Daily  ? carvedilol  3.125 mg Oral BID WC  ? cholecalciferol  2,000 Units Oral Daily  ? enoxaparin (LOVENOX) injection  40 mg Subcutaneous Q24H  ? ferrous sulfate  325 mg Oral QODAY  ? gabapentin  300 mg Oral Daily  ? pantoprazole  40 mg Oral Q0600  ? sodium chloride flush  3 mL Intravenous Q12H  ? sodium chloride flush  3 mL Intravenous Q12H  ? ?Continuous Infusions: ? sodium chloride 100 mL/hr at 01/10/22 2352  ? sodium chloride    ? ?PRN Meds: ?sodium chloride, acetaminophen, ALPRAZolam, magnesium hydroxide, morphine injection, nitroGLYCERIN, ondansetron (ZOFRAN) IV, sodium chloride flush, traZODone  ? ?Vital Signs  ?  ?Vitals:  ? 01/10/22 1932 01/10/22 2319 01/11/22 0402 01/11/22 0737  ?BP: 129/83 120/72 105/72 118/74  ?Pulse: 75 67 65 73  ?Resp: '16 18 20 18  '$ ?Temp: 98.1 ?F (36.7 ?C) 98.5 ?F (36.9 ?C) 98 ?F (36.7 ?C) 98.1 ?F (36.7 ?C)  ?TempSrc: Oral     ?SpO2: 98% 97% 100% 99%  ?Weight:      ?Height:      ? ? ?Intake/Output Summary (Last 24 hours) at 01/11/2022 0959 ?Last data filed at 01/11/2022 0500 ?Gross per 24 hour  ?Intake 916.49 ml  ?Output --  ?Net 916.49 ml  ? ?Filed Weights  ? 01/09/22 2318 01/10/22 1125  ?Weight: 65.8 kg 65.8 kg  ? ? ?Physical Exam  ? ?GEN: Well nourished, well developed, in no acute distress.  ?HEENT: Grossly normal.  ?Neck: Supple, no JVD, carotid bruits, or masses. ?Cardiac: RRR, 2/6 syst murmur throughout, no rubs or gallops. No clubbing, cyanosis, edema.  Radials 2+, DP/PT 2+ and equal bilaterally. R radial cath site w/o bleeding, bruit, or  hematoma. ?Respiratory:  Respirations regular and unlabored, clear to auscultation bilaterally. ?GI: Soft, nontender, nondistended, BS + x 4. ?MS: no deformity or atrophy. ?Skin: warm and dry, no rash. ?Neuro:  Strength and sensation are intact. ?Psych: AAOx3.  Normal affect. ? ?Labs  ?  ?Chemistry ?Recent Labs  ?Lab 01/09/22 ?2320 01/11/22 ?1062  ?NA 137 138  ?K 3.7 3.8  ?CL 107 110  ?CO2 24 23  ?GLUCOSE 147* 106*  ?BUN 15 10  ?CREATININE 0.48 0.55  ?CALCIUM 8.7* 8.2*  ?GFRNONAA >60 >60  ?ANIONGAP 6 5  ?  ? ?Hematology ?Recent Labs  ?Lab 01/09/22 ?2320  ?WBC 7.3  ?RBC 4.28  ?HGB 10.2*  ?HCT 33.1*  ?MCV 77.3*  ?MCH 23.8*  ?MCHC 30.8  ?RDW 17.4*  ?PLT 377  ? ? ?Cardiac Enzymes  ?Recent Labs  ?Lab 01/09/22 ?2320 01/10/22 ?0216 01/10/22 ?6948 01/10/22 ?1524 01/10/22 ?1712  ?TROPONINIHS 7 22* 65* 67* 61*  ?   ? ?BNP ?   ?Component Value Date/Time  ? BNP 7.8 01/09/2022 2320  ? ?Lipids  ?Lab Results  ?Component Value Date  ? CHOL 182 01/11/2022  ? HDL 54 01/11/2022  ? LDLCALC 113 (H) 01/11/2022  ? TRIG 75 01/11/2022  ? CHOLHDL 3.4  01/11/2022  ? ?Radiology  ?  ?DG Chest 2 View ? ?Result Date: 01/09/2022 ?CLINICAL DATA:  Chest pain and shortness of breath. EXAM: CHEST - 2 VIEW COMPARISON:  Chest radiograph dated 08/22/2010 FINDINGS: The heart size and mediastinal contours are within normal limits. Both lungs are clear. The visualized skeletal structures are unremarkable. IMPRESSION: No active cardiopulmonary disease. Electronically Signed   By: Anner Crete M.D.   On: 01/09/2022 23:36  ? ?Telemetry  ?  ?RSR - Personally Reviewed ? ?Cardiac Studies  ? ?Cardiac Catheterization  5.5.2023 ? ?Left Main  ?Vessel is angiographically normal.  ?Left Anterior Descending  ?First Diagonal Branch  ?Vessel is large in size. Vessel is angiographically normal.  ?Second Diagonal Branch  ?Vessel is small in size. Vessel is angiographically normal.  ?Third Diagonal Branch  ?Vessel is small in size. Vessel is angiographically normal.  ?Left  Circumflex  ?Vessel is angiographically normal.  ?First Obtuse Marginal Branch  ?Vessel is small in size. Vessel is angiographically normal.  ?Second Obtuse Marginal Branch  ?Vessel is angiographically normal.  ?Third Obtuse Marginal Branch  ?Vessel is angiographically normal.  ?Right Coronary Artery  ?The vessel exhibits minimal luminal irregularities.  ?Right Posterior Atrioventricular Artery  ?Vessel is small in size.  ? ?EF 55-65%  ?_____________  ? ?Patient Profile  ?   ?51 y.o. female w/ a h/o HTN, DMII, pheochromocytoma s/p L adrenalectomy (2014), microcytic anemia, RBBB, and HSV, who presented 5/4 w/ chest pain and mild HsTrop elevation.  Nl cors on cath. ? ?Assessment & Plan  ?  ?1.  NSTEMI/Chest pain:  Pt presented 5/4 w/ chest pain and mild HsTrop elevation to a peak of 67. Cath 5/5 w/ minimal, nonobstructive RCA dzs and nl LV fxn.  No culprit for symptoms/enzymatic changes.  Cont asa, statin, and ? blocker.  Cont PPI for possible non-cardiac c/p.  I will see her back in clinic next week. ? ?2.  Essential HTN:  was not on meds @ home but BP elevated in ED.  Stable on ? blocker. Can f/u as outpt and consider ARB given h/o DM. ? ?3.  HL:  LDL 113.  With mild HsTrop elevation, mild CAD, and DM, cont current dose of atorvastatin. ? ?4.  DMII:  on metformin as outpt. ? ?5.  Microcytic anemia:  stable yesterday.  CBC not ordered this AM. ? ?6.  Systolic murmur:  echo pending.  ? ?Signed, ?Murray Hodgkins, NP  ?01/11/2022, 9:59 AM   ? ?For questions or updates, please contact   ?Please consult [email or web address] for contact info under Cardiology/STEMI.  ?

## 2022-01-11 NOTE — Plan of Care (Signed)

## 2022-01-11 NOTE — Discharge Summary (Addendum)
?Physician Discharge Summary ?  ?Patient: Leah Stephenson MRN: [id number] DOB: Oct 11, 1970  ?Admit date:     01/10/2022  ?Discharge date: 01/11/2022  ?Discharge Physician: Ezekiel Slocumb  ? ?PCP: [place]  ? ?Recommendations at discharge:  ? ?Follow up with Cardiology next week, as instructed ? ? ?Discharge Diagnoses: ?Active Problems: ?  Type 2 diabetes mellitus with peripheral neuropathy ([place]) ?  GERD without esophagitis ?  Iron deficiency anemia ? ?Principal Problem (Resolved): ?  Chest pain ? ?Hospital Course: ?HPI on admission: "51 y.o. Hispanic female with medical history significant for hypertension, type 2 diabetes mellitus and pheochromocytoma as well as anemia, who presented to the ER with a Kalisetti of chest pain felt as pressure and graded 9/10 in severity that started on the right side and extended to her left chest with associated nausea without vomiting or diaphoresis.  She denied any cough or wheezing or hemoptysis.  No leg pain or edema or recent travels or surgeries.  No fever or chills.  No bleeding diathesis.  She denies any dysuria, oliguria or hematuria or flank pain.  She was given 4 baby aspirin and her pain was better. ?  ?ED Course: When she came to the ER BP was 164/87 with otherwise normal vital signs.  Later on BP was 147/92.  Labs revealed mild anemia close to previous levels and BMP was unremarkable.  High-sensitivity troponin was 7 and later 22 and BNP was 7.8.  Quantitative beta-hCG was negative ?EKG as reviewed by me : EKG showed sinus rhythm with a rate of 74 with right bundle branch block. ?Imaging: Two-view chest x-ray showed no acute cardiopulmonary disease. ? ?The patient was given 4 baby aspirin.  She will be admitted to observation cardiac telemetry bed for further evaluation and management.  " ? ? ?5/5: Cardiology took patient to the Cath Lab where no significant coronary disease was seen.  Observing patient overnight and echocardiogram is pending. ? ?5/6:  patient has remained chest pain free.  Stable for discharge today. ? ?Assessment and Plan: ?* Chest pain-resolved as of 01/22/2022 ?Cardiology was consulted. ?"Chest pain/elevated troponin ?Peak troponin of 60, essentially nontrending x3 ?Nonobstructive coronary disease on catheterization yesterday ?Normal ejection fraction on echocardiogram ?No evidence of stress-induced cardiomyopathy ?Denies any recurrent symptoms, feels well ?Blood pressure stable ?Started on carvedilol low-dose, aspirin, statin" ? ?Type 2 diabetes mellitus with peripheral neuropathy ([facility]) ?Placed on supplement coverage with NovoLog. ?Held off metformin. ?Continue Neurontin. ? ?GERD without esophagitis ?on PPI therapy. ? ?Iron deficiency anemia ?- We will continue ferrous sulfate. ? ? ? ? ?  ? ? ?Consultants: Cardiology ?Procedures performed: Left heart cath, see report  ?Disposition: Home ?Diet recommendation:  ?Discharge Diet Orders (From admission, onward)  ? ?  Start     Ordered  ? 01/11/22 0000  Diet - low sodium heart healthy       ? 01/11/22 1204  ? ?  ?  ? ?  ? ?Cardiac and Carb modified diet ?DISCHARGE MEDICATION: ?Allergies as of 01/11/2022   ?No Known Allergies ?  ? ?  ?Medication List  ?  ? ?TAKE these medications   ? ?aspirin EC 81 MG tablet ?Take 1 tablet (81 mg total) by mouth daily. Swallow whole. ?  ?carvedilol 3.125 MG tablet ?Commonly known as: COREG ?Take 1 tablet (3.125 mg total) by mouth 2 (two) times daily with a meal. ?  ?Cholecalciferol 25 MCG (1000 UT) tablet ?Take 2,000 Units by mouth daily. ?  ?  gabapentin 300 MG capsule ?Commonly known as: NEURONTIN ?Take 300 mg by mouth daily. ?  ?Iron 325 (65 Fe) MG Tabs ?Take 1 tablet by mouth every other day. ?  ?metFORMIN 500 MG tablet ?Commonly known as: GLUCOPHAGE ?Take 500 mg by mouth daily. ?  ?pantoprazole 40 MG tablet ?Commonly known as: PROTONIX ?Take 1 tablet (40 mg total) by mouth daily at 6 (six) AM. ?  ? ?  ? ? Follow-up Information   ? ? [place]  Schedule an appointment as soon as possible for a visit .   ?Contact information: ?[address] ?[place] [address] ?[phone] ? ? ?  ?  ? ? Theora Gianotti, NP Follow up on 01/15/2022.   ?Specialties: Nurse Practitioner, Cardiology, Radiology ?Why: 2:20 PM ?Contact information: ?[address] ?[address] ?[place] [address] ?[phone] ? ? ?  ?  ? ?  ?  ? ?  ? ?Discharge Exam: ?Filed Weights  ? 01/09/22 2318 01/10/22 1125  ?Weight: 65.8 kg 65.8 kg  ? ?General exam: awake, alert, no acute distress ?HEENT: atraumatic, clear conjunctiva, anicteric sclera, moist mucus membranes, hearing grossly normal  ?Respiratory system: CTAB, no wheezes, rales or rhonchi, normal respiratory effort. ?Cardiovascular system: normal S1/S2, RRR, no JVD, murmurs, rubs, gallops, no pedal edema.   ?Gastrointestinal system: soft, NT, ND, no HSM felt, +bowel sounds. ?Central nervous system: A&O x4. no gross focal neurologic deficits, normal speech ?Extremities: moves all, no edema, normal tone ?Skin: dry, intact, normal temperature, normal color, No rashes, lesions or ulcers ?Psychiatry: normal mood, congruent affect, judgement and insight appear normal ? ? ?Condition at discharge: stable ? ?The results of significant diagnostics from this hospitalization (including imaging, microbiology, ancillary and laboratory) are listed below for reference.  ? ?Imaging Studies: ?DG Chest 2 View ? ?Result Date: 01/09/2022 ?CLINICAL DATA:  Chest pain and shortness of breath. EXAM: CHEST - 2 VIEW COMPARISON:  Chest radiograph dated 08/22/2010 FINDINGS: The heart size and mediastinal contours are within normal limits. Both lungs are clear. The visualized skeletal structures are unremarkable. IMPRESSION: No active cardiopulmonary disease. Electronically Signed   By: Anner Crete M.D.   On: 01/09/2022 23:36  ? ?CARDIAC CATHETERIZATION ? ?Result Date: 01/10/2022 ?  The left ventricular systolic function is normal.   LV end diastolic  pressure is normal.   The left ventricular ejection fraction is 55-65% by visual estimate. 1.  Near normal coronary arteries with no evidence of obstructive coronary artery disease. 2.  Normal LV systolic function with no clear wall motion abnormalities.  Left ventricular angiography was suboptimal due to poor visualization of the apex. Recommendations: No clear culprit is identified for unstable angina.  Proceed with an echocardiogram to better evaluate wall motion and exclude stress-induced cardiomyopathy. Observe overnight and if no issues, can discharge home tomorrow.  ? ?ECHOCARDIOGRAM COMPLETE ? ?Result Date: 01/11/2022 ?   ECHOCARDIOGRAM REPORT   Patient Name:   LENNIX ROTUNDO Date of Exam: 01/11/2022 Medical Rec #:  [id number]    Height:       59.0 in Accession #:    [id number]   Weight:       145.0 lb Date of Birth:  02-19-1971    BSA:          1.609 m? Patient Age:    51 years     BP:           118/74 mmHg Patient Gender: F  HR:           71 bpm. Exam Location:  [facility] Procedure: 2D Echo Indications:     Murmur  History:         Patient has no prior history of Echocardiogram examinations.                  Risk Factors:Hypertension and Diabetes.  Sonographer:     L Thornton-Maynard Referring Phys:  [place] Diagnosing Phys: Ida Rogue MD IMPRESSIONS  1. Left ventricular ejection fraction, by estimation, is 60 to 65%. The left ventricle has normal function. The left ventricle has no regional wall motion abnormalities. Left ventricular diastolic parameters are consistent with Grade I diastolic dysfunction (impaired relaxation).  2. Right ventricular systolic function is normal. The right ventricular size is normal. There is normal pulmonary artery systolic pressure. The estimated right ventricular systolic pressure is 26.4 mmHg.  3. The mitral valve is normal in structure. No evidence of mitral valve regurgitation. No evidence of mitral stenosis.  4. The aortic valve is normal in structure.  Aortic valve regurgitation is not visualized. No aortic stenosis is present.  5. The inferior vena cava is normal in size with greater than 50% respiratory variability, suggesting right atrial pressure of 3 mmHg.

## 2022-01-15 ENCOUNTER — Encounter: Payer: Self-pay | Admitting: Nurse Practitioner

## 2022-01-15 ENCOUNTER — Ambulatory Visit: Payer: 59 | Admitting: Nurse Practitioner

## 2022-01-15 VITALS — BP 104/70 | HR 70 | Ht <= 58 in | Wt 147.1 lb

## 2022-01-15 DIAGNOSIS — E785 Hyperlipidemia, unspecified: Secondary | ICD-10-CM

## 2022-01-15 DIAGNOSIS — I2510 Atherosclerotic heart disease of native coronary artery without angina pectoris: Secondary | ICD-10-CM | POA: Diagnosis not present

## 2022-01-15 DIAGNOSIS — I214 Non-ST elevation (NSTEMI) myocardial infarction: Secondary | ICD-10-CM | POA: Diagnosis not present

## 2022-01-15 DIAGNOSIS — I10 Essential (primary) hypertension: Secondary | ICD-10-CM | POA: Diagnosis not present

## 2022-01-15 NOTE — Patient Instructions (Signed)
Medication Instructions:  ?No changes at this time.  ? ?*If you need a refill on your cardiac medications before your next appointment, please call your pharmacy* ? ? ?Lab Work: ?None ? ?If you have labs (blood work) drawn today and your tests are completely normal, you will receive your results only by: ?MyChart Message (if you have MyChart) OR ?A paper copy in the mail ?If you have any lab test that is abnormal or we need to change your treatment, we will call you to review the results. ? ? ?Testing/Procedures: ?None ? ? ?Follow-Up: ?At [facility], you and your health needs are our priority.  As part of our continuing mission to provide you with exceptional heart care, we have created designated Provider Care Teams.  These Care Teams include your primary Cardiologist (physician) and Advanced Practice Providers (APPs -  Physician Assistants and Nurse Practitioners) who all work together to provide you with the care you need, when you need it. ? ? ?Your next appointment:   ?1 month(s) ? ?The format for your next appointment:   ?In Person ? ?Provider:   ?Kathlyn Sacramento, MD or Murray Hodgkins, NP  ? ? ? ? ?Important Information About Sugar ? ? ? ? ?  ?

## 2022-01-15 NOTE — Progress Notes (Signed)
? ? ?Office Visit  ?  ?Patient Name: Leah Stephenson ?Date of Encounter: 01/15/2022 ? ?Primary Care Provider:  [place] ?Primary Cardiologist:  Kathlyn Sacramento, MD ? ?Chief Complaint  ?  ?51 year old female with a history of hypertension, type 2 diabetes mellitus, pheochromocytoma status post left adrenalectomy (2014), microcytic anemia, right bundle branch block, and HSV, who presents for follow-up after recent hospitalization for chest pain and mild troponin elevation. ? ?Past Medical History  ?  ?Past Medical History:  ?Diagnosis Date  ? Demand ischemia ([place])   ? a. 01/2022 Admitted w/ chest pain and HsTrop of 67-->Cath: LM nl, LAD nl, D1/2/3 nl, LCX nl, OM1/2/3 nl, RCA min irregs, RPAV small/nl. EF 55-65%.  ? Diastolic dysfunction   ? a. 01/2022 Echo: EF 60-65%, no rwma, GrI DD, nl RV fxn, RVSP 25.84mHg.  No significant valvular disease.  ? HSV (herpes simplex virus) anogenital infection   ? Hypertension   ? Microcytic anemia   ? Overweight (BMI 25.0-29.9)   ? Pheochromocytoma   ? RBBB   ? [place]   ? ?Past Surgical History:  ?Procedure Laterality Date  ? colonoscopy with polypectomy     ? COLONOSCOPY WITH PROPOFOL N/A 07/09/2018  ? Procedure: COLONOSCOPY WITH PROPOFOL;  Surgeon: SLollie Sails MD;  [facility];  Service: Endoscopy;  Laterality: N/A;  ? ESOPHAGOGASTRODUODENOSCOPY (EGD) WITH PROPOFOL N/A 07/09/2018  ? Procedure: ESOPHAGOGASTRODUODENOSCOPY (EGD) WITH PROPOFOL;  Surgeon: SLollie Sails MD;  [facility];  Service: Endoscopy;  Laterality: N/A;  ? Left adrenalectomy for pheochromocytoma  02/2013  ? LEFT HEART CATH AND CORONARY ANGIOGRAPHY N/A 01/10/2022  ? Procedure: LEFT HEART CATH AND CORONARY ANGIOGRAPHY;  Surgeon: AWellington Hampshire MD;  [facility];  Service: Cardiovascular;  Laterality: N/A;  ? TUBAL LIGATION    ? ? ?Allergies ? ?Allergies  ?Allergen Reactions  ? Atorvastatin Calcium   ?  Diarrhea;  mylagias.  ? ? ?History of Present Illness  ?  ?51year old female with the above past medical history including hypertension, type 2 diabetes mellitus, pheochromocytoma status post left adrenalectomy in 2014, microcytic anemia, right bundle branch block, and HSV.  She lives locally with her husband and works in a [employer]  [facility] after developing persistent chest pain the evening before while at church.  In the emergency department, initial troponin was normal but subsequent values elevated to 22 and subsequently 67.  ECG was unremarkable with baseline right bundle branch block.  Decision was made to undertake diagnostic catheterization which was performed on May 5 and showed only minor irregularities in the right coronary artery and otherwise normal coronary arteries and normal LV function with an EF of 55 to 65%.  She had no recurrent symptoms overnight and follow-up echo on May 6 showed an EF of 60 to 65% with grade 1 diastolic dysfunction, RVSP of 25.3 mmHg, and no significant valvular disease.  Ms. PLeona Singletonwas discharged home on May 6 on aspirin, statin, and low-dose beta-blocker therapy. ? ?Following discharge, she experienced diarrhea and myalgias on statin therapy and subsequently discontinued it.  Symptoms have resolved.  She has otherwise done well without chest pain or dyspnea.  Her right wrist is bruised and mildly tender but otherwise stable.  She denies palpitations, PND, orthopnea, dizziness, syncope, edema, or early satiety. ? ?Home Medications  ?  ?Current Outpatient Medications  ?Medication Sig Dispense Refill  ?  aspirin EC 81 MG tablet Take 1 tablet (81 mg total) by mouth daily. Swallow whole. 30 tablet 1  ? carvedilol (COREG) 3.125 MG tablet Take 1 tablet (3.125 mg total) by mouth 2 (two) times daily with a meal. 60 tablet 1  ? Cholecalciferol 25 MCG (1000 UT) tablet Take 2,000 Units by mouth daily.    ? Ferrous Sulfate (IRON) 325 (65  Fe) MG TABS Take 1 tablet by mouth every other day.    ? gabapentin (NEURONTIN) 300 MG capsule Take 300 mg by mouth daily.    ? metFORMIN (GLUCOPHAGE) 500 MG tablet Take 500 mg by mouth daily.    ? pantoprazole (PROTONIX) 40 MG tablet Take 1 tablet (40 mg total) by mouth daily at 6 (six) AM. 30 tablet 1  ? ?No current facility-administered medications for this visit.  ?  ? ?Review of Systems  ?  ?Had myalgias and diarrhea on atorvastatin and so discontinued.  Mild bruising of her right wrist.  She denies chest pain, dyspnea, palpitations, PND, orthopnea, dizziness, syncope, edema, or early satiety.  All other systems reviewed and are otherwise negative except as noted above. ? ? ?Cardiac Rehabilitation Eligibility Assessment  ?   ? ?Physical Exam  ?  ?VS:  BP 104/70 (BP Location: Left Arm, Patient Position: Sitting, Cuff Size: Normal)   Pulse 70   Ht '4\' 9"'$  (1.448 m)   Wt 147 lb 2 oz (66.7 kg)   LMP 12/26/2021 (Approximate)   SpO2 98%   BMI 31.84 kg/m?  , BMI Body mass index is 31.84 kg/m?. ?    ?GEN: Well nourished, well developed, in no acute distress. ?HEENT: normal. ?Neck: Supple, no JVD, carotid bruits, or masses. ?Cardiac: RRR, 1/6 systolic murmur heard throughout.  No rubs or gallops.  No clubbing, cyanosis, edema.  Radials/DP/PT 2+ and equal bilaterally.  ?Respiratory:  Respirations regular and unlabored, clear to auscultation bilaterally. ?GI: Soft, nontender, nondistended, BS + x 4. ?MS: no deformity or atrophy. ?Skin: warm and dry, no rash. ?Neuro:  Strength and sensation are intact. ?Psych: Normal affect. ? ?Accessory Clinical Findings  ?  ?ECG personally reviewed by me today -regular sinus rhythm, 70, right bundle branch block- no acute changes. ? ?Lab Results  ?Component Value Date  ? WBC 7.3 01/09/2022  ? HGB 10.2 (L) 01/09/2022  ? HCT 33.1 (L) 01/09/2022  ? MCV 77.3 (L) 01/09/2022  ? PLT 377 01/09/2022  ? ?Lab Results  ?Component Value Date  ? CREATININE 0.55 01/11/2022  ? BUN 10 01/11/2022  ?  NA 138 01/11/2022  ? K 3.8 01/11/2022  ? CL 110 01/11/2022  ? CO2 23 01/11/2022  ? ?Lab Results  ?Component Value Date  ? ALT 25 03/01/2013  ? AST 22 03/01/2013  ? ALKPHOS 67 03/01/2013  ? BILITOT 0.5 03/01/2013  ? ?Lab Results  ?Component Value Date  ? CHOL 182 01/11/2022  ? HDL 54 01/11/2022  ? LDLCALC 113 (H) 01/11/2022  ? TRIG 75 01/11/2022  ? CHOLHDL 3.4 01/11/2022  ?  ?Assessment & Plan  ?  ?1.  NSTEMI, subsequent episode of care/nonobstructive CAD: Patient presented to [facility] on May 4 with persistent chest discomfort that started while at church that evening.  Peak troponin of 67.  Catheterization on May 5 with minimal, nonobstructive RCA disease and normal LV function.  No culprit for symptoms or enzymatic changes.  She has been feeling well since discharge without chest pain or dyspnea.  She remains on aspirin, beta-blocker, and PPI  therapy.  She did not tolerate atorvastatin secondary to development of diarrhea and myalgias.  She is not interested in trying an alternate statin or other cholesterol medicine and instead will focus on lifestyle modifications.  She will consider cardiac rehabilitation. ? ?2.  Essential hypertension: Noted during hospitalization with significant improvement on carvedilol therapy. ? ?3.  Hyperlipidemia: LDL of 113.  With recent troponin elevation, mild CAD, and diabetes, she was placed on atorvastatin however, she did not tolerate this due to diarrhea and myalgias and is not interested in trying an alternate statin or other nonstatin cholesterol medication.  She will try lifestyle modifications. ? ?4.  Type 2 diabetes mellitus: On metformin. ? ?5.  Systolic murmur: Soft systolic murmur heard throughout without significant valvular disease on echo. ? ?6.  Disposition: Follow-up in clinic in 4 to 6 weeks or sooner if necessary. ? ? ?Murray Hodgkins, NP ?01/15/2022, 3:04 PM ? ?

## 2022-01-20 ENCOUNTER — Telehealth: Payer: Self-pay | Admitting: Nurse Practitioner

## 2022-01-20 NOTE — Telephone Encounter (Signed)
Patient called stating she needs a letter stating she can return to work, she needs the letter today.   ?

## 2022-01-20 NOTE — Telephone Encounter (Signed)
Spoke with patient and reviewed that provider is out of the office today and that we can try to get that letter done tomorrow and I will give her a call once it is ready for her to pick up. She verbalized understanding with no further questions at this time.  ?

## 2022-01-20 NOTE — Telephone Encounter (Signed)
Any restrictions? ?

## 2022-01-20 NOTE — Telephone Encounter (Signed)
Patient is following up. She would like to know how soon the return to work letter can be completed. ?

## 2022-01-20 NOTE — Telephone Encounter (Signed)
I am out of the office today, but yes, Leah Stephenson may return to work.  Her out of work note previously covered through yesterday. ?

## 2022-01-20 NOTE — Telephone Encounter (Signed)
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

## 2022-01-20 NOTE — Telephone Encounter (Signed)
To Ignacia Bayley, NP to review. ?Confirmed with Ashley, [place], that she made the patient aware her letter may not be ready today. ? ?

## 2022-01-21 ENCOUNTER — Encounter: Payer: Self-pay | Admitting: *Deleted

## 2022-01-21 NOTE — Telephone Encounter (Signed)
Reviewed with provider and patient may return to work with no restrictions. Letter completed and will call patient to make her aware.  ?

## 2022-01-21 NOTE — Telephone Encounter (Signed)
Spoke with patient and advised letter has been completed and she prefers to pick up in person. No further needs ?

## 2022-01-22 ENCOUNTER — Encounter: Payer: Self-pay | Admitting: Family Medicine

## 2022-02-27 ENCOUNTER — Encounter: Payer: Self-pay | Admitting: Nurse Practitioner

## 2022-02-27 ENCOUNTER — Ambulatory Visit (INDEPENDENT_AMBULATORY_CARE_PROVIDER_SITE_OTHER): Payer: 59 | Admitting: Nurse Practitioner

## 2022-02-27 VITALS — BP 110/62 | HR 66 | Ht <= 58 in | Wt 149.4 lb

## 2022-02-27 DIAGNOSIS — I2510 Atherosclerotic heart disease of native coronary artery without angina pectoris: Secondary | ICD-10-CM | POA: Diagnosis not present

## 2022-02-27 DIAGNOSIS — E785 Hyperlipidemia, unspecified: Secondary | ICD-10-CM | POA: Diagnosis not present

## 2022-02-27 DIAGNOSIS — I10 Essential (primary) hypertension: Secondary | ICD-10-CM

## 2022-02-27 NOTE — Progress Notes (Signed)
Office Visit    Patient Name: Leah Stephenson Date of Encounter: 02/27/2022  Primary Care Provider:  [place] Primary Cardiologist:  Kathlyn Sacramento, MD  Chief Complaint    51 year old female with a history of hypertension, type 2 diabetes mellitus, pheochromocytoma status post left adrenalectomy in 2014, microcytic anemia, right bundle branch block, and HSV, who presents for follow-up of chest pain and CAD.  Past Medical History    Past Medical History:  Diagnosis Date   Chest pain 01/10/2022   Demand ischemia ([place])    a. 01/2022 Admitted w/ chest pain and HsTrop of 67-->Cath: LM nl, LAD nl, D1/2/3 nl, LCX nl, OM1/2/3 nl, RCA min irregs, RPAV small/nl. EF 55-65%.   Diastolic dysfunction    a. 01/2022 Echo: EF 60-65%, no rwma, GrI DD, nl RV fxn, RVSP 25.3mHg.  No significant valvular disease.   HSV (herpes simplex virus) anogenital infection    Hypertension    Microcytic anemia    Overweight (BMI 25.0-29.9)    Pheochromocytoma    RBBB    [place]    Past Surgical History:  Procedure Laterality Date   colonoscopy with polypectomy      COLONOSCOPY WITH PROPOFOL N/A 07/09/2018   Procedure: COLONOSCOPY WITH PROPOFOL;  Surgeon: SLollie Sails MD;  [facility];  Service: Endoscopy;  Laterality: N/A;   ESOPHAGOGASTRODUODENOSCOPY (EGD) WITH PROPOFOL N/A 07/09/2018   Procedure: ESOPHAGOGASTRODUODENOSCOPY (EGD) WITH PROPOFOL;  Surgeon: SLollie Sails MD;  [facility];  Service: Endoscopy;  Laterality: N/A;   Left adrenalectomy for pheochromocytoma  02/2013   LEFT HEART CATH AND CORONARY ANGIOGRAPHY N/A 01/10/2022   Procedure: LEFT HEART CATH AND CORONARY ANGIOGRAPHY;  Surgeon: AWellington Hampshire MD;  [facility];  Service: Cardiovascular;  Laterality: N/A;   TUBAL LIGATION      Allergies  Allergies  Allergen Reactions   Atorvastatin Calcium     Diarrhea; mylagias.    History of Present  Illness    51year old female with the above past medical history including hypertension, type 2 diabetes mellitus, pheochromocytoma status post left adrenalectomy in 2014, microcytic anemia, right bundle branch block, and HSV.  [facility] developing chest pain the evening before while at church.  In the ED, initial troponin was normal but subsequent values elevated to 22  67.ECG was unremarkable with baseline right bundle branch block.  She underwent diagnostic catheterization which showed minor irregularities in the right coronary artery and otherwise normal coronary arteries and normal LV function with an EF of 55 to 65%.  Echocardiogram showed an EF of 60 to 65% with grade 1 diastolic dysfunction, RVSP of 25.3 mmHg, and no significant valvular disease.  She was subsequently discharged on aspirin, statin, and beta-blocker therapy.  Ms. PTommye Standardwas last seen in cardiology clinic on May 10, at which time she reported diarrhea and myalgias in the setting of statin therapy and therefore, she had stopped it.  She was not interested in trying an alternate statin or other cholesterol medicine.  She was not having any chest pain.  Since her last visit, she has continued to do well.  She works full-time's in [employer]	[place] and walks throughout the day.  Her job also involves some lifting, which she tolerates well.  She denies chest pain, dyspnea, palpitations, PND, orthopnea, dizziness, syncope, edema, or early satiety.  She notes that she has not been exercising and would  like to lose weight.  She has not heard from cardiac rehab.  She reiterated today that she is not interested in starting an alternate statin or other cholesterol medication.  Home Medications    Current Outpatient Medications  Medication Sig Dispense Refill   aspirin EC 81 MG tablet Take 1 tablet (81 mg total) by mouth daily. Swallow whole. 30 tablet 1   carvedilol (COREG) 3.125 MG tablet  Take 1 tablet (3.125 mg total) by mouth 2 (two) times daily with a meal. 60 tablet 1   Cholecalciferol 25 MCG (1000 UT) tablet Take 2,000 Units by mouth daily.     Ferrous Sulfate (IRON) 325 (65 Fe) MG TABS Take 1 tablet by mouth every other day.     gabapentin (NEURONTIN) 300 MG capsule Take 300 mg by mouth daily.     metFORMIN (GLUCOPHAGE) 500 MG tablet Take 500 mg by mouth daily.     pantoprazole (PROTONIX) 40 MG tablet Take 1 tablet (40 mg total) by mouth daily at 6 (six) AM. 30 tablet 1   UNABLE TO FIND TRUE METRIX GLUCOSE TEST STRIP     No current facility-administered medications for this visit.     Review of Systems    She denies chest pain, palpitations, dyspnea, pnd, orthopnea, n, v, dizziness, syncope, edema, weight gain, or early satiety.  All other systems reviewed and are otherwise negative except as noted above.   Cardiac Rehabilitation Eligibility Assessment      Physical Exam    VS:  BP 110/62 (BP Location: Left Arm, Patient Position: Sitting, Cuff Size: Normal)   Pulse 66   Ht '4\' 9"'$  (1.448 m)   Wt 149 lb 6.4 oz (67.8 kg)   SpO2 99%   BMI 32.33 kg/m  , BMI Body mass index is 32.33 kg/m.     GEN: Well nourished, well developed, in no acute distress. HEENT: normal. Neck: Supple, no JVD, carotid bruits, or masses. Cardiac: RRR, 2/6 systolic murmur heard throughout.  No rubs or gallops. No clubbing, cyanosis, edema.  Radials/PT 2+ and equal bilaterally.  Respiratory:  Respirations regular and unlabored, clear to auscultation bilaterally. GI: Soft, nontender, nondistended, BS + x 4. MS: no deformity or atrophy. Skin: warm and dry, no rash. Neuro:  Strength and sensation are intact. Psych: Normal affect.  Accessory Clinical Findings    ECG personally reviewed by me today -regular sinus rhythm, 66, right bundle branch block- no acute changes.  Lab Results  Component Value Date   WBC 7.3 01/09/2022   HGB 10.2 (L) 01/09/2022   HCT 33.1 (L) 01/09/2022   MCV  77.3 (L) 01/09/2022   PLT 377 01/09/2022   Lab Results  Component Value Date   CREATININE 0.55 01/11/2022   BUN 10 01/11/2022   NA 138 01/11/2022   K 3.8 01/11/2022   CL 110 01/11/2022   CO2 23 01/11/2022   Lab Results  Component Value Date   ALT 25 03/01/2013   AST 22 03/01/2013   ALKPHOS 67 03/01/2013   BILITOT 0.5 03/01/2013   Lab Results  Component Value Date   CHOL 182 01/11/2022   HDL 54 01/11/2022   LDLCALC 113 (H) 01/11/2022   TRIG 75 01/11/2022   CHOLHDL 3.4 01/11/2022     Assessment & Plan    1.  Nonobstructive CAD: Patient presented to [facility] May 4 with chest discomfort and mild troponin elevation of 67.  Catheterization showed minimal, nonobstructive RCA disease with normal LV function.  No culprit for  symptoms or enzymatic changes were identified and she has been medically managed with aspirin and beta-blocker therapy.  She did not tolerate statin therapy and is not interested in pursuing a different statin or alternative cholesterol-lowering agent.  She is not having any chest pain or dyspnea.  We discussed the importance of lifestyle modifications and regular exercise today.  I also encouraged her to consider cardiac rehabilitation, for which she has an active order.  2.  Essential hypertension: Noted during hospitalization though stable on carvedilol therapy.  3.  Hyperlipidemia: LDL of 113.  She did not tolerate statin therapy due to diarrhea and myalgias.  She is not interested in an alternate statin or alternative cholesterol lowering medication at this time.  Strongly encouraged regular exercise and weight loss.  4.  Type 2 diabetes mellitus: On metformin therapy and managed by primary care.  5.  Systolic murmur: 2/6 systolic murmur heard throughout without significant valvular disease on echo.  6.  Disposition: Follow-up in clinic in 4 months or sooner if necessary.   Murray Hodgkins, NP 02/27/2022, 9:59 AM

## 2022-02-27 NOTE — Patient Instructions (Signed)
Medication Instructions:  No changes at this time.   *If you need a refill on your cardiac medications before your next appointment, please call your pharmacy*   Lab Work: None  If you have labs (blood work) drawn today and your tests are completely normal, you will receive your results only by: [place] (if you have MyChart) OR A paper copy in the mail If you have any lab test that is abnormal or we need to change your treatment, we will call you to review the results.   Testing/Procedures: None   Follow-Up: At [facility], you and your health needs are our priority.  As part of our continuing mission to provide you with exceptional heart care, we have created designated Provider Care Teams.  These Care Teams include your primary Cardiologist (physician) and Advanced Practice Providers (APPs -  Physician Assistants and Nurse Practitioners) who all work together to provide you with the care you need, when you need it.  We recommend signing up for the patient portal called "MyChart".  Sign up information is provided on this After Visit Summary.  MyChart is used to connect with patients for Virtual Visits (Telemedicine).  Patients are able to view lab/test results, encounter notes, upcoming appointments, etc.  Non-urgent messages can be sent to your provider as well.   To learn more about what you can do with MyChart, go to [email or web address].    Your next appointment:   4 month(s)  The format for your next appointment:   In Person  Provider:   Kathlyn Sacramento, MD or Murray Hodgkins, NP        Important Information About Sugar

## 2022-07-08 ENCOUNTER — Ambulatory Visit: Payer: Self-pay | Admitting: Cardiovascular Disease

## 2024-03-09 ENCOUNTER — Encounter: Payer: Self-pay | Admitting: Nurse Practitioner

## 2024-03-10 ENCOUNTER — Telehealth: Payer: Self-pay | Admitting: *Deleted
